# Patient Record
Sex: Female | Born: 1955
Health system: Southern US, Community
[De-identification: ages and names within clinical notes are randomized; demographics above are authoritative.]

## PROBLEM LIST (undated history)

## (undated) DIAGNOSIS — I10 Essential (primary) hypertension: Secondary | ICD-10-CM

## (undated) DIAGNOSIS — F319 Bipolar disorder, unspecified: Secondary | ICD-10-CM

## (undated) HISTORY — DX: Bipolar disorder, unspecified: F31.9

## (undated) HISTORY — DX: Essential (primary) hypertension: I10

---

## 2014-06-21 DIAGNOSIS — R3129 Other microscopic hematuria: Secondary | ICD-10-CM | POA: Insufficient documentation

## 2016-07-21 DIAGNOSIS — N390 Urinary tract infection, site not specified: Secondary | ICD-10-CM | POA: Insufficient documentation

## 2017-10-26 DIAGNOSIS — M1909 Primary osteoarthritis, other specified site: Secondary | ICD-10-CM | POA: Insufficient documentation

## 2019-07-31 DIAGNOSIS — R110 Nausea: Secondary | ICD-10-CM | POA: Insufficient documentation

## 2019-12-19 DIAGNOSIS — R634 Abnormal weight loss: Secondary | ICD-10-CM | POA: Insufficient documentation

## 2019-12-19 DIAGNOSIS — Z9884 Bariatric surgery status: Secondary | ICD-10-CM | POA: Insufficient documentation

## 2020-12-04 DIAGNOSIS — F411 Generalized anxiety disorder: Secondary | ICD-10-CM | POA: Insufficient documentation

## 2020-12-04 DIAGNOSIS — F3181 Bipolar II disorder: Secondary | ICD-10-CM | POA: Insufficient documentation

## 2020-12-04 DIAGNOSIS — I10 Essential (primary) hypertension: Secondary | ICD-10-CM | POA: Insufficient documentation

## 2020-12-04 DIAGNOSIS — F17210 Nicotine dependence, cigarettes, uncomplicated: Secondary | ICD-10-CM | POA: Insufficient documentation

## 2021-08-04 DIAGNOSIS — E538 Deficiency of other specified B group vitamins: Secondary | ICD-10-CM | POA: Diagnosis not present

## 2021-08-04 DIAGNOSIS — R5383 Other fatigue: Secondary | ICD-10-CM | POA: Diagnosis not present

## 2021-08-04 DIAGNOSIS — Z136 Encounter for screening for cardiovascular disorders: Secondary | ICD-10-CM | POA: Diagnosis not present

## 2021-08-04 DIAGNOSIS — F418 Other specified anxiety disorders: Secondary | ICD-10-CM | POA: Diagnosis not present

## 2021-08-04 DIAGNOSIS — Z0001 Encounter for general adult medical examination with abnormal findings: Secondary | ICD-10-CM | POA: Diagnosis not present

## 2021-08-04 DIAGNOSIS — Z131 Encounter for screening for diabetes mellitus: Secondary | ICD-10-CM | POA: Diagnosis not present

## 2021-08-04 DIAGNOSIS — I10 Essential (primary) hypertension: Secondary | ICD-10-CM | POA: Diagnosis not present

## 2021-08-18 DIAGNOSIS — I10 Essential (primary) hypertension: Secondary | ICD-10-CM | POA: Diagnosis not present

## 2021-08-18 DIAGNOSIS — E782 Mixed hyperlipidemia: Secondary | ICD-10-CM | POA: Diagnosis not present

## 2021-08-18 DIAGNOSIS — F418 Other specified anxiety disorders: Secondary | ICD-10-CM | POA: Diagnosis not present

## 2021-08-18 DIAGNOSIS — E538 Deficiency of other specified B group vitamins: Secondary | ICD-10-CM | POA: Diagnosis not present

## 2021-08-22 DIAGNOSIS — M26603 Bilateral temporomandibular joint disorder, unspecified: Secondary | ICD-10-CM | POA: Diagnosis not present

## 2021-09-04 DIAGNOSIS — M542 Cervicalgia: Secondary | ICD-10-CM | POA: Diagnosis not present

## 2021-09-04 DIAGNOSIS — R519 Headache, unspecified: Secondary | ICD-10-CM | POA: Diagnosis not present

## 2021-09-04 DIAGNOSIS — M26603 Bilateral temporomandibular joint disorder, unspecified: Secondary | ICD-10-CM | POA: Diagnosis not present

## 2021-09-04 DIAGNOSIS — M2560 Stiffness of unspecified joint, not elsewhere classified: Secondary | ICD-10-CM | POA: Diagnosis not present

## 2021-09-05 DIAGNOSIS — K921 Melena: Secondary | ICD-10-CM | POA: Diagnosis not present

## 2021-09-05 DIAGNOSIS — R197 Diarrhea, unspecified: Secondary | ICD-10-CM | POA: Diagnosis not present

## 2021-09-05 DIAGNOSIS — Z86010 Personal history of colonic polyps: Secondary | ICD-10-CM | POA: Diagnosis not present

## 2021-09-10 DIAGNOSIS — R195 Other fecal abnormalities: Secondary | ICD-10-CM | POA: Diagnosis not present

## 2021-09-10 DIAGNOSIS — K6389 Other specified diseases of intestine: Secondary | ICD-10-CM | POA: Diagnosis not present

## 2021-09-10 DIAGNOSIS — Z9884 Bariatric surgery status: Secondary | ICD-10-CM | POA: Diagnosis not present

## 2021-09-10 DIAGNOSIS — K921 Melena: Secondary | ICD-10-CM | POA: Diagnosis not present

## 2021-09-10 DIAGNOSIS — R197 Diarrhea, unspecified: Secondary | ICD-10-CM | POA: Diagnosis not present

## 2021-09-10 DIAGNOSIS — K648 Other hemorrhoids: Secondary | ICD-10-CM | POA: Diagnosis not present

## 2021-09-10 DIAGNOSIS — Z8719 Personal history of other diseases of the digestive system: Secondary | ICD-10-CM | POA: Diagnosis not present

## 2021-09-10 DIAGNOSIS — Z86010 Personal history of colonic polyps: Secondary | ICD-10-CM | POA: Diagnosis not present

## 2021-09-17 DIAGNOSIS — M2560 Stiffness of unspecified joint, not elsewhere classified: Secondary | ICD-10-CM | POA: Diagnosis not present

## 2021-09-17 DIAGNOSIS — M542 Cervicalgia: Secondary | ICD-10-CM | POA: Diagnosis not present

## 2021-09-17 DIAGNOSIS — F411 Generalized anxiety disorder: Secondary | ICD-10-CM | POA: Diagnosis not present

## 2021-09-17 DIAGNOSIS — R519 Headache, unspecified: Secondary | ICD-10-CM | POA: Diagnosis not present

## 2021-09-17 DIAGNOSIS — F32A Depression, unspecified: Secondary | ICD-10-CM | POA: Diagnosis not present

## 2021-09-17 DIAGNOSIS — M26603 Bilateral temporomandibular joint disorder, unspecified: Secondary | ICD-10-CM | POA: Diagnosis not present

## 2021-09-22 ENCOUNTER — Ambulatory Visit: Payer: BLUE CROSS/BLUE SHIELD | Admitting: Cardiology

## 2021-09-22 DIAGNOSIS — J209 Acute bronchitis, unspecified: Secondary | ICD-10-CM | POA: Diagnosis not present

## 2021-09-26 DIAGNOSIS — M2560 Stiffness of unspecified joint, not elsewhere classified: Secondary | ICD-10-CM | POA: Diagnosis not present

## 2021-09-26 DIAGNOSIS — M26603 Bilateral temporomandibular joint disorder, unspecified: Secondary | ICD-10-CM | POA: Diagnosis not present

## 2021-09-26 DIAGNOSIS — R519 Headache, unspecified: Secondary | ICD-10-CM | POA: Diagnosis not present

## 2021-09-26 DIAGNOSIS — M542 Cervicalgia: Secondary | ICD-10-CM | POA: Diagnosis not present

## 2021-10-02 DIAGNOSIS — M79675 Pain in left toe(s): Secondary | ICD-10-CM | POA: Diagnosis not present

## 2021-10-03 DIAGNOSIS — R519 Headache, unspecified: Secondary | ICD-10-CM | POA: Diagnosis not present

## 2021-10-03 DIAGNOSIS — M26603 Bilateral temporomandibular joint disorder, unspecified: Secondary | ICD-10-CM | POA: Diagnosis not present

## 2021-10-03 DIAGNOSIS — M2560 Stiffness of unspecified joint, not elsewhere classified: Secondary | ICD-10-CM | POA: Diagnosis not present

## 2021-10-03 DIAGNOSIS — M542 Cervicalgia: Secondary | ICD-10-CM | POA: Diagnosis not present

## 2021-10-07 ENCOUNTER — Encounter: Payer: Self-pay | Admitting: Podiatry

## 2021-10-07 ENCOUNTER — Ambulatory Visit (INDEPENDENT_AMBULATORY_CARE_PROVIDER_SITE_OTHER): Payer: BLUE CROSS/BLUE SHIELD | Admitting: Podiatry

## 2021-10-07 ENCOUNTER — Ambulatory Visit (INDEPENDENT_AMBULATORY_CARE_PROVIDER_SITE_OTHER): Payer: BLUE CROSS/BLUE SHIELD

## 2021-10-07 DIAGNOSIS — M2042 Other hammer toe(s) (acquired), left foot: Secondary | ICD-10-CM

## 2021-10-07 DIAGNOSIS — M7752 Other enthesopathy of left foot: Secondary | ICD-10-CM

## 2021-10-07 DIAGNOSIS — M778 Other enthesopathies, not elsewhere classified: Secondary | ICD-10-CM | POA: Diagnosis not present

## 2021-10-07 DIAGNOSIS — G5792 Unspecified mononeuropathy of left lower limb: Secondary | ICD-10-CM

## 2021-10-07 MED ORDER — TRIAMCINOLONE ACETONIDE 40 MG/ML IJ SUSP
60.0000 mg | Freq: Once | INTRAMUSCULAR | Status: AC
Start: 2021-10-07 — End: 2021-10-07
  Administered 2021-10-07: 60 mg

## 2021-10-07 NOTE — Progress Notes (Signed)
  Subjective:  Patient ID: Desiree Torres, female    DOB: 06/28/1956,  MRN: [id number] HPI Chief Complaint  Patient presents with   Toe Pain    2-5 toes left - aching x 1 year, callused area tip of 3rd toe left, throbs at night, tried using Voltaren gel   New Patient (Initial Visit)    65 y.o. female presents with the above complaint.   ROS: Denies fever chills nausea vomiting muscle aches pains calf pain back pain chest pain shortness of breath.  No past medical history on file.   Current Outpatient Medications:    amLODipine (NORVASC) 10 MG tablet, Take by mouth., Disp: , Rfl:    ARIPiprazole (ABILIFY) 10 MG tablet, Take by mouth., Disp: , Rfl:    BUPROPION HBR ER PO, Take by mouth., Disp: , Rfl:    DULoxetine (CYMBALTA) 30 MG capsule, Take 1 capsule in the morning and 2 capsules at night, Disp: , Rfl:    Multiple Vitamin (MULTIVITAMIN) capsule, Take 1 capsule by mouth daily., Disp: , Rfl:    telmisartan-hydrochlorothiazide (MICARDIS HCT) 80-12.5 MG tablet, , Disp: , Rfl:    traZODone (DESYREL) 50 MG tablet, Take 2-3 tablets at bedtime for sleep., Disp: , Rfl:    omeprazole (PRILOSEC) 20 MG capsule, Take by mouth., Disp: , Rfl:   Allergies  Allergen Reactions   Pork Allergy     Other reaction(s): GI Upset (intolerance)   Pork-Derived Products     Other reaction(s): GI Upset   Ham     Other reaction(s): GI Upset   Morphine Hives and Itching    hives hives hives hives    Review of Systems Objective:  There were no vitals filed for this visit.  General: Well developed, nourished, in no acute distress, alert and oriented x3   Dermatological: Skin is warm, dry and supple bilateral. Nails x 10 are well maintained; remaining integument appears unremarkable at this time. There are no open sores, no preulcerative lesions, no rash or signs of infection present.  Vascular: Dorsalis Pedis artery and Posterior Tibial artery pedal pulses are 2/4 bilateral with immedate capillary  fill time. Pedal hair growth present. No varicosities and no lower extremity edema present bilateral.   Neruologic: Grossly intact via light touch bilateral. Vibratory intact via tuning fork bilateral. Protective threshold with Semmes Wienstein monofilament intact to all pedal sites bilateral. Patellar and Achilles deep tendon reflexes 2+ bilateral. No Babinski or clonus noted bilateral.   Musculoskeletal: No gross boney pedal deformities bilateral. No pain, crepitus, or limitation noted with foot and ankle range of motion bilateral. Muscular strength 5/5 in all groups tested bilateral.  Pain on palpation and range of motion of the second third fourth metatarsal phalange joints of the left foot.  She also has some interdigital pain consistent with possible neuroma.  Gait: Unassisted, Nonantalgic.    Radiographs:  Radiographs demonstrate no osseous abnormalities forefoot left  Assessment & Plan:   Assessment: Capsulitis second third and fourth metatarsal phalangeal joints.  Possible neuroma interdigital spaces.  Plan: Injected 3 areas today 10 mg forefoot Kenalog second third fourth interdigital spaces.     Desiree Torres T. [place], [place]

## 2021-10-09 DIAGNOSIS — M26603 Bilateral temporomandibular joint disorder, unspecified: Secondary | ICD-10-CM | POA: Diagnosis not present

## 2021-10-09 DIAGNOSIS — R519 Headache, unspecified: Secondary | ICD-10-CM | POA: Diagnosis not present

## 2021-10-09 DIAGNOSIS — M542 Cervicalgia: Secondary | ICD-10-CM | POA: Diagnosis not present

## 2021-10-09 DIAGNOSIS — M2560 Stiffness of unspecified joint, not elsewhere classified: Secondary | ICD-10-CM | POA: Diagnosis not present

## 2021-10-10 ENCOUNTER — Ambulatory Visit: Payer: BLUE CROSS/BLUE SHIELD | Admitting: Cardiology

## 2021-10-15 DIAGNOSIS — F411 Generalized anxiety disorder: Secondary | ICD-10-CM | POA: Diagnosis not present

## 2021-10-15 DIAGNOSIS — F32A Depression, unspecified: Secondary | ICD-10-CM | POA: Diagnosis not present

## 2021-10-17 DIAGNOSIS — M26603 Bilateral temporomandibular joint disorder, unspecified: Secondary | ICD-10-CM | POA: Diagnosis not present

## 2021-10-17 DIAGNOSIS — R519 Headache, unspecified: Secondary | ICD-10-CM | POA: Diagnosis not present

## 2021-10-17 DIAGNOSIS — M542 Cervicalgia: Secondary | ICD-10-CM | POA: Diagnosis not present

## 2021-10-17 DIAGNOSIS — R197 Diarrhea, unspecified: Secondary | ICD-10-CM | POA: Diagnosis not present

## 2021-10-17 DIAGNOSIS — M2560 Stiffness of unspecified joint, not elsewhere classified: Secondary | ICD-10-CM | POA: Diagnosis not present

## 2021-10-24 ENCOUNTER — Other Ambulatory Visit: Payer: Self-pay

## 2021-10-24 ENCOUNTER — Ambulatory Visit: Payer: BLUE CROSS/BLUE SHIELD | Admitting: Cardiology

## 2021-10-24 ENCOUNTER — Encounter: Payer: Self-pay | Admitting: Cardiology

## 2021-10-24 VITALS — BP 136/73 | HR 60 | Temp 98.0°F | Resp 16 | Ht 65.0 in | Wt 153.0 lb

## 2021-10-24 DIAGNOSIS — I3139 Other pericardial effusion (noninflammatory): Secondary | ICD-10-CM

## 2021-10-24 DIAGNOSIS — I10 Essential (primary) hypertension: Secondary | ICD-10-CM | POA: Diagnosis not present

## 2021-10-24 DIAGNOSIS — F17210 Nicotine dependence, cigarettes, uncomplicated: Secondary | ICD-10-CM

## 2021-10-24 DIAGNOSIS — E782 Mixed hyperlipidemia: Secondary | ICD-10-CM | POA: Diagnosis not present

## 2021-10-24 DIAGNOSIS — F1290 Cannabis use, unspecified, uncomplicated: Secondary | ICD-10-CM

## 2021-10-24 DIAGNOSIS — E538 Deficiency of other specified B group vitamins: Secondary | ICD-10-CM | POA: Diagnosis not present

## 2021-10-24 NOTE — Progress Notes (Signed)
Date:  10/24/2021   ID:  Desiree Torres, DOB Nov 10, 1956, MRN [id number]  PCP:  Genia Hotter, FNP  Cardiologist:  Tessa Lerner, DO, [facility] (established care 10/24/21) Former Cardiologist: Dr.Thanawala at [facility] in [place].   REASON FOR CONSULT: Fluid around the heart  REQUESTING PHYSICIAN: Self-referred  Chief Complaint  Patient presents with   New Patient (Initial Visit)    Self-referral to the office. History of fluid around the heart.   Establish care    HPI  Desiree Torres is a 65 y.o. African-American female who presents to the office with a chief complaint of " history of fluid around the heart." Patient's past medical history and cardiovascular risk factors include: Hypertension, hyperlipidemia, bipolar, Depression, cigarette smoking, marijuana use, postmenopausal, advance age.   She is self referred to the office for evaluation of fluid around my heart.   Patient recently moved from [place] to [place] as of August 2022.  She was seeing a cardiologist back in [place] and was told that she has fluid around the heart.  She does not know any additional details.  When asked that she denies undergoing pericardiocentesis.  Patient states that also recalls as the medications for her blood pressure were changed and now she is on amlodipine.  No records available in Care Everywhere.  Patient is not the best historian with regards to her past medical history.  No prior records available for review.  She denies any chest pain or shortness of breath at rest or with effort related activities.  She is overall euvolemic and not in congestive heart failure.  And no hospitalizations or urgent care visits recently for cardiovascular symptoms.  FUNCTIONAL STATUS: She states that every other day she walks 2 miles.    ALLERGIES: Allergies  Allergen Reactions   Pork Allergy     Other reaction(s): GI Upset (intolerance)   Pork-Derived Products     Other reaction(s): GI Upset    Ham     Other reaction(s): GI Upset   Morphine Hives and Itching    hives hives hives hives     MEDICATION LIST PRIOR TO VISIT: Current Meds  Medication Sig   amLODipine (NORVASC) 10 MG tablet Take by mouth.   ARIPiprazole (ABILIFY) 10 MG tablet Take by mouth.   BUPROPION HBR ER PO Take by mouth.   DULoxetine (CYMBALTA) 30 MG capsule Take 1 capsule in the morning and 2 capsules at night   folic acid (FOLVITE) 1 MG tablet Take 1 mg by mouth daily.   Multiple Vitamin (MULTIVITAMIN) capsule Take 1 capsule by mouth daily.   omeprazole (PRILOSEC) 20 MG capsule Take by mouth.   telmisartan-hydrochlorothiazide (MICARDIS HCT) 80-12.5 MG tablet    timolol (TIMOPTIC) 0.5 % ophthalmic solution SMARTSIG:In Eye(s)   traZODone (DESYREL) 50 MG tablet Take 2-3 tablets at bedtime for sleep.     PAST MEDICAL HISTORY: Past Medical History:  Diagnosis Date   Bipolar depression (HCC)    Hypertension     PAST SURGICAL HISTORY: History reviewed. No pertinent surgical history.  FAMILY HISTORY: The patient family history includes Cancer in her father; Heart disease in her mother and sister.  SOCIAL HISTORY:  The patient  reports that she has been smoking cigarettes. She has a 12.50 pack-year smoking history. She has never used smokeless tobacco. She reports current alcohol use. She reports current drug use. Drug: Marijuana.  REVIEW OF SYSTEMS: Review of Systems  Constitutional: Negative for chills and fever.  HENT:  Negative for  hoarse voice and nosebleeds.   Eyes:  Negative for discharge, double vision and pain.  Cardiovascular:  Negative for chest pain, claudication, dyspnea on exertion, leg swelling, near-syncope, orthopnea, palpitations, paroxysmal nocturnal dyspnea and syncope.  Respiratory:  Negative for hemoptysis and shortness of breath.   Musculoskeletal:  Negative for muscle cramps and myalgias.  Gastrointestinal:  Negative for abdominal pain, constipation, diarrhea, hematemesis,  hematochezia, melena, nausea and vomiting.  Neurological:  Negative for dizziness and light-headedness.   PHYSICAL EXAM: Vitals with BMI 10/24/2021  Height 5\' 5"   Weight 153 lbs  BMI 25.46  Systolic 136  Diastolic 73  Pulse 60    CONSTITUTIONAL: Well-developed and well-nourished. No acute distress.  SKIN: Skin is warm and dry. No rash noted. No cyanosis. No pallor. No jaundice HEAD: Normocephalic and atraumatic.  EYES: No scleral icterus MOUTH/THROAT: Moist oral membranes.  NECK: No JVD present. No thyromegaly noted. No carotid bruits  LYMPHATIC: No visible cervical adenopathy.  CHEST Normal respiratory effort. No intercostal retractions  LUNGS: Clear to auscultation bilaterally.  No stridor. No wheezes. No rales.  CARDIOVASCULAR: Regular rate and rhythm, positive S1-S2, no murmurs rubs or gallops appreciated. ABDOMINAL:  No apparent ascites.  EXTREMITIES: No peripheral edema, warm to touch, HEMATOLOGIC: No significant bruising NEUROLOGIC: Oriented to person, place, and time. Nonfocal. Normal muscle tone.  PSYCHIATRIC: Normal mood and affect. Normal behavior. Cooperative  CARDIAC DATABASE: EKG: 10/24/2021: Sinus bradycardia, 54 bpm, normal axis, without underlying ischemia or injury pattern.  Echocardiogram: No results found for this or any previous visit from the past 1095 days.    Stress Testing: No results found for this or any previous visit from the past 1095 days.   Heart Catheterization: None  LABORATORY DATA: No flowsheet data found.  No flowsheet data found.  Lipid Panel  No results found for: CHOL, TRIG, HDL, CHOLHDL, VLDL, LDLCALC, LDLDIRECT, LABVLDL  No components found for: NTPROBNP No results for input(s): PROBNP in the last 8760 hours. No results for input(s): TSH in the last 8760 hours.  BMP No results for input(s): NA, K, CL, CO2, GLUCOSE, BUN, CREATININE, CALCIUM, GFRNONAA, GFRAA in the last 8760 hours.  HEMOGLOBIN A1C No results found for:  HGBA1C, MPG  External Labs: Collected: 12/04/2020 Care Everywhere Total cholesterol 224, triglycerides 114, HDL 69, LDL 132, non-HDL 155 Hemoglobin A1c 5.4 TSH 1.26 Sodium 140, potassium 3.5, chloride 107, BUN 15, creatinine 0.92. AST 15, ALT 14, alkaline phosphatase 82   IMPRESSION:    ICD-10-CM   1. Pericardial effusion  I31.39 ECHOCARDIOGRAM COMPLETE    2. Benign hypertension  I10 EKG 12-Lead    3. Mixed hyperlipidemia  E78.2     4. Cigarette nicotine dependence without complication  F17.210     5. Marijuana use  F12.90        RECOMMENDATIONS: Desiree Torres is a 65 y.o. African-American female whose past medical history and cardiac risk factors include: Hypertension, hyperlipidemia, bipolar, Depression, cigarette smoking, marijuana use, postmenopausal, advance age.   Per patient she has a history of pericardial effusion.  I do not have any records to verify this information.  She states that it was diagnosed last year and her medications were changed and now she is on amlodipine.  No records are available in Care Everywhere.  I reached out to her former cardiologist as part of today's visit to obtain prior records.  For now would like to repeat echocardiogram to evaluate for LVEF and the severity of pericardial effusion.  Medications reconciled.  Patient is  educated on the importance of complete cessation of cigarette smoking and marijuana use.  Further recommendations to follow upon availability of records and testing results.  FINAL MEDICATION LIST END OF ENCOUNTER: No orders of the defined types were placed in this encounter.   There are no discontinued medications.   Current Outpatient Medications:    amLODipine (NORVASC) 10 MG tablet, Take by mouth., Disp: , Rfl:    ARIPiprazole (ABILIFY) 10 MG tablet, Take by mouth., Disp: , Rfl:    BUPROPION HBR ER PO, Take by mouth., Disp: , Rfl:    DULoxetine (CYMBALTA) 30 MG capsule, Take 1 capsule in the morning and 2  capsules at night, Disp: , Rfl:    folic acid (FOLVITE) 1 MG tablet, Take 1 mg by mouth daily., Disp: , Rfl:    Multiple Vitamin (MULTIVITAMIN) capsule, Take 1 capsule by mouth daily., Disp: , Rfl:    omeprazole (PRILOSEC) 20 MG capsule, Take by mouth., Disp: , Rfl:    telmisartan-hydrochlorothiazide (MICARDIS HCT) 80-12.5 MG tablet, , Disp: , Rfl:    timolol (TIMOPTIC) 0.5 % ophthalmic solution, SMARTSIG:In Eye(s), Disp: , Rfl:    traZODone (DESYREL) 50 MG tablet, Take 2-3 tablets at bedtime for sleep., Disp: , Rfl:   Orders Placed This Encounter  Procedures   EKG 12-Lead   ECHOCARDIOGRAM COMPLETE    There are no Patient Instructions on file for this visit.   --Continue cardiac medications as reconciled in final medication list. --Return in about 4 weeks (around 11/21/2021) for Follow up after echocardiogram. Or sooner if needed. --Continue follow-up with your primary care physician regarding the management of your other chronic comorbid conditions.  Patient's questions and concerns were addressed to her satisfaction. She voices understanding of the instructions provided during this encounter.   This note was created using a voice recognition software as a result there may be grammatical errors inadvertently enclosed that do not reflect the nature of this encounter. Every attempt is made to correct such errors.  Tessa Lerner, [place], [facility]  Pager: [phone] Office: [phone]

## 2021-10-27 DIAGNOSIS — R928 Other abnormal and inconclusive findings on diagnostic imaging of breast: Secondary | ICD-10-CM | POA: Diagnosis not present

## 2021-10-27 DIAGNOSIS — R922 Inconclusive mammogram: Secondary | ICD-10-CM | POA: Diagnosis not present

## 2021-10-28 ENCOUNTER — Other Ambulatory Visit: Payer: BLUE CROSS/BLUE SHIELD

## 2021-10-31 DIAGNOSIS — M26603 Bilateral temporomandibular joint disorder, unspecified: Secondary | ICD-10-CM | POA: Diagnosis not present

## 2021-10-31 DIAGNOSIS — R519 Headache, unspecified: Secondary | ICD-10-CM | POA: Diagnosis not present

## 2021-10-31 DIAGNOSIS — M542 Cervicalgia: Secondary | ICD-10-CM | POA: Diagnosis not present

## 2021-10-31 DIAGNOSIS — M2560 Stiffness of unspecified joint, not elsewhere classified: Secondary | ICD-10-CM | POA: Diagnosis not present

## 2021-11-05 ENCOUNTER — Other Ambulatory Visit: Payer: Self-pay

## 2021-11-05 ENCOUNTER — Ambulatory Visit: Payer: BLUE CROSS/BLUE SHIELD

## 2021-11-05 DIAGNOSIS — I3139 Other pericardial effusion (noninflammatory): Secondary | ICD-10-CM | POA: Diagnosis not present

## 2021-11-05 DIAGNOSIS — I10 Essential (primary) hypertension: Secondary | ICD-10-CM | POA: Diagnosis not present

## 2021-11-12 DIAGNOSIS — Z0000 Encounter for general adult medical examination without abnormal findings: Secondary | ICD-10-CM | POA: Diagnosis not present

## 2021-11-12 DIAGNOSIS — M2560 Stiffness of unspecified joint, not elsewhere classified: Secondary | ICD-10-CM | POA: Diagnosis not present

## 2021-11-12 DIAGNOSIS — M542 Cervicalgia: Secondary | ICD-10-CM | POA: Diagnosis not present

## 2021-11-12 DIAGNOSIS — M26603 Bilateral temporomandibular joint disorder, unspecified: Secondary | ICD-10-CM | POA: Diagnosis not present

## 2021-11-12 DIAGNOSIS — R519 Headache, unspecified: Secondary | ICD-10-CM | POA: Diagnosis not present

## 2021-11-14 DIAGNOSIS — D242 Benign neoplasm of left breast: Secondary | ICD-10-CM | POA: Diagnosis not present

## 2021-11-14 DIAGNOSIS — D0512 Intraductal carcinoma in situ of left breast: Secondary | ICD-10-CM | POA: Diagnosis not present

## 2021-11-14 DIAGNOSIS — N6012 Diffuse cystic mastopathy of left breast: Secondary | ICD-10-CM | POA: Diagnosis not present

## 2021-11-14 DIAGNOSIS — N6002 Solitary cyst of left breast: Secondary | ICD-10-CM | POA: Diagnosis not present

## 2021-11-14 DIAGNOSIS — N6342 Unspecified lump in left breast, subareolar: Secondary | ICD-10-CM | POA: Diagnosis not present

## 2021-11-17 DIAGNOSIS — Z01419 Encounter for gynecological examination (general) (routine) without abnormal findings: Secondary | ICD-10-CM | POA: Diagnosis not present

## 2021-11-18 HISTORY — PX: BREAST BIOPSY: SHX20

## 2021-11-24 ENCOUNTER — Other Ambulatory Visit: Payer: Self-pay | Admitting: Cardiology

## 2021-11-24 ENCOUNTER — Ambulatory Visit: Payer: BLUE CROSS/BLUE SHIELD | Admitting: Cardiology

## 2021-11-25 ENCOUNTER — Other Ambulatory Visit: Payer: Self-pay | Admitting: Cardiology

## 2021-11-25 DIAGNOSIS — I3139 Other pericardial effusion (noninflammatory): Secondary | ICD-10-CM

## 2021-11-26 DIAGNOSIS — Z0000 Encounter for general adult medical examination without abnormal findings: Secondary | ICD-10-CM | POA: Diagnosis not present

## 2021-11-26 DIAGNOSIS — E538 Deficiency of other specified B group vitamins: Secondary | ICD-10-CM | POA: Diagnosis not present

## 2021-11-26 DIAGNOSIS — Z131 Encounter for screening for diabetes mellitus: Secondary | ICD-10-CM | POA: Diagnosis not present

## 2021-11-26 DIAGNOSIS — Z13220 Encounter for screening for lipoid disorders: Secondary | ICD-10-CM | POA: Diagnosis not present

## 2021-11-28 DIAGNOSIS — M26603 Bilateral temporomandibular joint disorder, unspecified: Secondary | ICD-10-CM | POA: Diagnosis not present

## 2021-11-28 DIAGNOSIS — R519 Headache, unspecified: Secondary | ICD-10-CM | POA: Diagnosis not present

## 2021-11-28 DIAGNOSIS — M542 Cervicalgia: Secondary | ICD-10-CM | POA: Diagnosis not present

## 2021-11-28 DIAGNOSIS — M2560 Stiffness of unspecified joint, not elsewhere classified: Secondary | ICD-10-CM | POA: Diagnosis not present

## 2021-12-02 NOTE — Progress Notes (Signed)
Called pt no answer, left a vm

## 2021-12-03 ENCOUNTER — Other Ambulatory Visit: Payer: Self-pay

## 2021-12-03 ENCOUNTER — Encounter: Payer: Self-pay | Admitting: Podiatry

## 2021-12-03 ENCOUNTER — Ambulatory Visit: Payer: BLUE CROSS/BLUE SHIELD | Admitting: Podiatry

## 2021-12-03 DIAGNOSIS — M778 Other enthesopathies, not elsewhere classified: Secondary | ICD-10-CM

## 2021-12-03 DIAGNOSIS — G5792 Unspecified mononeuropathy of left lower limb: Secondary | ICD-10-CM | POA: Diagnosis not present

## 2021-12-03 DIAGNOSIS — M2042 Other hammer toe(s) (acquired), left foot: Secondary | ICD-10-CM | POA: Diagnosis not present

## 2021-12-03 MED ORDER — METHYLPREDNISOLONE 4 MG PO TBPK: ORAL_TABLET | ORAL | 0 refills | Status: DC

## 2021-12-03 NOTE — Progress Notes (Signed)
Patient called back, I have discussed results with patient, she confirmed appointment for 20th.

## 2021-12-03 NOTE — Progress Notes (Signed)
Called patient, NA, LMAM

## 2021-12-03 NOTE — Progress Notes (Signed)
°  Subjective:  Patient ID: Desiree Torres, female    DOB: [birth year],   MRN: [id number]  Chief Complaint  Patient presents with   capsulitis    F/U LT capsulitis/neuritis -pt states," the shot only lasted for a little bit, pain is back to where it used to be; 7/10." - no swelling/numbness/tingling Tx: diclofenac cream     65 y.o. female presents for follow-up of capsulitis/neuritis of left hammertoes. Patient relates the injection she received from Dr. Al Corpus worked for about 2 weeks but now that pain has returned and wondering what she can do . Denies any other pedal complaints. Denies n/v/f/c.   Past Medical History:  Diagnosis Date   Bipolar depression (HCC)    Hypertension     Objective:  Physical Exam: Vascular: DP/PT pulses 2/4 bilateral. CFT <3 seconds. Normal hair growth on digits. No edema.  Skin. No lacerations or abrasions bilateral feet.  Musculoskeletal: MMT 5/5 bilateral lower extremities in DF, PF, Inversion and Eversion. Deceased ROM in DF of ankle joint. Hammered digits 2-4 bilateral. Tender to dorsal palpation of the digits. Pain with ROM of digits 2-4.  Neurological: Sensation intact to light touch.   Assessment:  No diagnosis found.   Plan:  Patient was evaluated and treated and all questions answered. -X-rays reviewed. -Educated on hammertoes and neuritis  and treatment options  -Discussed padding including toe caps and crest pads.  -Prescription for medrol dose pack provided to aid with inflammation.  -Patient to follow-up as needed. Discussed calling if any changes or increased pain.    Desiree Torres, DPM

## 2021-12-05 DIAGNOSIS — H04121 Dry eye syndrome of right lacrimal gland: Secondary | ICD-10-CM | POA: Diagnosis not present

## 2021-12-05 DIAGNOSIS — Z961 Presence of intraocular lens: Secondary | ICD-10-CM | POA: Diagnosis not present

## 2021-12-05 DIAGNOSIS — H16223 Keratoconjunctivitis sicca, not specified as Sjogren's, bilateral: Secondary | ICD-10-CM | POA: Diagnosis not present

## 2021-12-05 DIAGNOSIS — H04123 Dry eye syndrome of bilateral lacrimal glands: Secondary | ICD-10-CM | POA: Diagnosis not present

## 2021-12-06 DIAGNOSIS — J069 Acute upper respiratory infection, unspecified: Secondary | ICD-10-CM | POA: Diagnosis not present

## 2021-12-06 DIAGNOSIS — U071 COVID-19: Secondary | ICD-10-CM | POA: Diagnosis not present

## 2021-12-06 DIAGNOSIS — Z20822 Contact with and (suspected) exposure to covid-19: Secondary | ICD-10-CM | POA: Diagnosis not present

## 2021-12-06 DIAGNOSIS — R059 Cough, unspecified: Secondary | ICD-10-CM | POA: Diagnosis not present

## 2021-12-09 ENCOUNTER — Ambulatory Visit: Payer: BLUE CROSS/BLUE SHIELD | Admitting: Cardiology

## 2021-12-19 DIAGNOSIS — M9901 Segmental and somatic dysfunction of cervical region: Secondary | ICD-10-CM | POA: Diagnosis not present

## 2021-12-19 DIAGNOSIS — M26601 Right temporomandibular joint disorder, unspecified: Secondary | ICD-10-CM | POA: Diagnosis not present

## 2021-12-19 DIAGNOSIS — M26602 Left temporomandibular joint disorder, unspecified: Secondary | ICD-10-CM | POA: Diagnosis not present

## 2021-12-19 DIAGNOSIS — M5409 Panniculitis affecting regions, neck and back, multiple sites in spine: Secondary | ICD-10-CM | POA: Diagnosis not present

## 2021-12-31 DIAGNOSIS — F5105 Insomnia due to other mental disorder: Secondary | ICD-10-CM | POA: Insufficient documentation

## 2021-12-31 DIAGNOSIS — F99 Mental disorder, not otherwise specified: Secondary | ICD-10-CM | POA: Insufficient documentation

## 2022-01-21 ENCOUNTER — Encounter: Payer: Self-pay | Admitting: Podiatry

## 2022-01-21 ENCOUNTER — Other Ambulatory Visit: Payer: Self-pay

## 2022-01-21 ENCOUNTER — Ambulatory Visit (INDEPENDENT_AMBULATORY_CARE_PROVIDER_SITE_OTHER): Payer: Self-pay | Admitting: Podiatry

## 2022-01-21 DIAGNOSIS — M2042 Other hammer toe(s) (acquired), left foot: Secondary | ICD-10-CM

## 2022-01-21 DIAGNOSIS — M778 Other enthesopathies, not elsewhere classified: Secondary | ICD-10-CM

## 2022-01-21 DIAGNOSIS — M7742 Metatarsalgia, left foot: Secondary | ICD-10-CM

## 2022-01-21 NOTE — Progress Notes (Signed)
°  Subjective:  Patient ID: Desiree Torres, female    DOB: [birth year],   MRN: [id number]  Chief Complaint  Patient presents with   Foot Pain    Follow up bilateral foot pain   "I still have pain in both. The left looks like its changing colors and the right one has this hard place on it"    66 y.o. female presents for follow-up of capsulitis/neuritis of left hammertoes. Relates the injeciton caused discoloration of her foot. Relates she has continued to have pain but now more on the bottom of the ball of the foot. Also relates some calluses she would like trimmed.  Denies any other pedal complaints. Denies n/v/f/c.   Past Medical History:  Diagnosis Date   Bipolar depression ([place])    Hypertension     Objective:  Physical Exam: Vascular: DP/PT pulses 2/4 bilateral. CFT <3 seconds. Normal hair growth on digits. No edema.  Skin. No lacerations or abrasions bilateral feet.  Hyperkeratotic lesions noted to distal third left digit, right medial hallux and right plantar fifth metatrsal.  Musculoskeletal: MMT 5/5 bilateral lower extremities in DF, PF, Inversion and Eversion. Deceased ROM in DF of ankle joint. Hammered digits 2-4 bilateral. Tender to plantar palpation of the metatarsal heads 2-4. Pain with ROM of digits 2-4.  Neurological: Sensation intact to light touch.   Assessment:   1. Hammer toe of left foot   2. Capsulitis of foot, left   3. Metatarsalgia of left foot      Plan:  Patient was evaluated and treated and all questions answered. -X-rays reviewed. -Educated on hammertoes and neuritis and metatarsalgia  and treatment options  -Discussed padding including metatarsal pads.  -Continue with anti-inflammatories  -Hyperkeratotic lesions trimmed as courtesy.  -Patient to follow-up as needed. Discussed calling if any changes or increased pain.    Lorenda Peck, DPM

## 2022-03-11 ENCOUNTER — Ambulatory Visit (INDEPENDENT_AMBULATORY_CARE_PROVIDER_SITE_OTHER): Payer: Medicare HMO | Admitting: Adult Health

## 2022-03-11 ENCOUNTER — Encounter: Payer: Self-pay | Admitting: Adult Health

## 2022-03-11 ENCOUNTER — Other Ambulatory Visit: Payer: Self-pay

## 2022-03-11 VITALS — BP 128/77 | HR 71 | Ht 65.0 in | Wt 144.0 lb

## 2022-03-11 DIAGNOSIS — F331 Major depressive disorder, recurrent, moderate: Secondary | ICD-10-CM | POA: Diagnosis not present

## 2022-03-11 DIAGNOSIS — F99 Mental disorder, not otherwise specified: Secondary | ICD-10-CM

## 2022-03-11 DIAGNOSIS — F411 Generalized anxiety disorder: Secondary | ICD-10-CM | POA: Diagnosis not present

## 2022-03-11 DIAGNOSIS — F419 Anxiety disorder, unspecified: Secondary | ICD-10-CM | POA: Insufficient documentation

## 2022-03-11 DIAGNOSIS — F3181 Bipolar II disorder: Secondary | ICD-10-CM | POA: Diagnosis not present

## 2022-03-11 DIAGNOSIS — F5105 Insomnia due to other mental disorder: Secondary | ICD-10-CM | POA: Diagnosis not present

## 2022-03-11 MED ORDER — BUSPIRONE HCL 5 MG PO TABS: 5.0000 mg | ORAL_TABLET | Freq: Three times a day (TID) | ORAL | 5 refills | Status: DC

## 2022-03-11 MED ORDER — TRAZODONE HCL 100 MG PO TABS: ORAL_TABLET | ORAL | 5 refills | Status: DC

## 2022-03-11 MED ORDER — ARIPIPRAZOLE 10 MG PO TABS: 10.0000 mg | ORAL_TABLET | Freq: Once | ORAL | 5 refills | Status: DC

## 2022-03-11 MED ORDER — DULOXETINE HCL 60 MG PO CPEP: 60.0000 mg | ORAL_CAPSULE | Freq: Every day | ORAL | 5 refills | Status: DC

## 2022-03-11 NOTE — Progress Notes (Signed)
[facility] MD/PA/NP Initial Note ? ?03/11/2022 11:02 AM ?Desiree Torres  ?MRN:  [id number] ? ?Chief Complaint:  ? ?HPI: ? ?Desiree Torres is seen today for initial psychiatric evaluation ? ?Previous diagnosis are GAD, MDD, insomnia and Bipolar disorder. ? ?Available collateral reviewed. ? ?Describes mood today as "hanging in there". Pleasant. Tearful at times. Mood symptoms - reports depression, anxiety, and irritability. Reports some worry and rumination. Reports mood inconsistencies - more down. Stating "I'm not doing as good as I would like". Reports taking medications before moving to [place] and felt like the regiemen worked well. She notes adjustment since finding a new provider in [place] and not doing as well - would like to return to previous medications. Is also wanting to start working with a therapist. Varying interest and motivation. Taking medications as prescribed. Reports moving to [place] from [place] after she and husband separated.  ?Energy levels lower. Active, does not have a regular exercise routine. Works full-time  ?Enjoys some usual interests and activities. Separated from husband of 2 years. Has 4 grown children. Moved to [place] fron [place] - family local. Spending time with family. ?Appetite decreased. Weight loss over the past year - 144 pounds - 65". ?Sleeps well most nights. Averages 3 to 4 hours - mind racing. ?Focus and concentration stable. Completing tasks. Managing aspects of household. 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 Works part time - 3 hours at an [employer] every day. ?Denies SI or HI.  ?Denies AH or VH. ?Denies recent alcohol use. ?Smokes tobacco and THC daily for pain - has a prostetic jaw and experinecing daily pain from it.  ? ?Previous medication trials:  Welbutrin, Buspar, Abilify, Lunesta, Trazadone, Cymbalta ? ?Visit Diagnosis:  ?  ICD-10-CM   ?1. Bipolar 2 disorder, major depressive episode (HCC)  F31.81 ARIPiprazole (ABILIFY) 10 MG tablet  ?  ?2. GAD  (generalized anxiety disorder)  F41.1 DULoxetine (CYMBALTA) 60 MG capsule  ?  busPIRone (BUSPAR) 5 MG tablet  ?  ?3. Insomnia due to other mental disorder  F51.05 traZODone (DESYREL) 100 MG tablet  ? F99   ?  ?4. Major depressive disorder, recurrent episode, moderate (HCC)  F33.1   ?  ? ? ?Past Psychiatric History: Admitted once last [place] of 2022 while living in [place] - nervous breakdown. ? ?Past Medical History:  ?Past Medical History:  ?Diagnosis Date  ? Bipolar depression (HCC)   ? Hypertension   ? No past surgical history on file. ? ?Family Psychiatric History: Denies any family history of mental illness.  ? ?Family History:  ?Family History  ?Problem Relation Age of Onset  ? Heart disease Mother   ? Cancer Father   ? Heart disease Sister   ? ? ?Social History:  ?Social History  ? ?Socioeconomic History  ? Marital status: Legally Separated  ?  Spouse name: Not on file  ? Number of children: 4  ? Years of education: Not on file  ? Highest education level: Not on file  ?Occupational History  ? Not on file  ?Tobacco Use  ? Smoking status: Every Day  ?  Packs/day: 0.50  ?  Years: 25.00  ?  Pack years: 12.50  ?  Types: Cigarettes  ? Smokeless tobacco: Never  ?Vaping Use  ? Vaping Use: Never used  ?Substance and Sexual Activity  ? Alcohol use: Yes  ?  Comment: occ  ? Drug use: Yes  ?  Types: Marijuana  ? Sexual activity: Not on  file  ?Other Topics Concern  ? Not on file  ?Social History Narrative  ? Not on file  ? ?Social Determinants of Health  ? ?Financial Resource Strain: Not on file  ?Food Insecurity: Not on file  ?Transportation Needs: Not on file  ?Physical Activity: Not on file  ?Stress: Not on file  ?Social Connections: Not on file  ? ? ?Allergies:  ?Allergies  ?Allergen Reactions  ? Pork Allergy   ?  Other reaction(s): GI Upset (intolerance)  ? Pork-Derived Products   ?  Other reaction(s): GI Upset  ? Ham   ?  Other reaction(s): GI Upset  ? Morphine Hives and Itching  ?  hives ?hives ?hives ?hives ?   ? ? ?Metabolic Disorder Labs: ?No results found for: HGBA1C, MPG ?No results found for: PROLACTIN ?No results found for: CHOL, TRIG, HDL, CHOLHDL, VLDL, LDLCALC ?No results found for: TSH ? ?Therapeutic Level Labs: ?No results found for: LITHIUM ?No results found for: VALPROATE ?No components found for:  CBMZ ? ?Current Medications: ?Current Outpatient Medications  ?Medication Sig Dispense Refill  ? busPIRone (BUSPAR) 5 MG tablet Take 1 tablet (5 mg total) by mouth 3 (three) times daily. 90 tablet 5  ? amLODipine (NORVASC) 10 MG tablet Take by mouth.    ? ARIPiprazole (ABILIFY) 10 MG tablet Take 1 tablet (10 mg total) by mouth once for 1 dose. 30 tablet 5  ? BUPROPION HBR ER PO Take by mouth.    ? DULoxetine (CYMBALTA) 60 MG capsule Take 1 capsule (60 mg total) by mouth daily. 30 capsule 5  ? folic acid (FOLVITE) 1 MG tablet Take 1 mg by mouth daily.    ? methylPREDNISolone (MEDROL DOSEPAK) 4 MG TBPK tablet Take as directed 21 tablet 0  ? Multiple Vitamin (MULTIVITAMIN) capsule Take 1 capsule by mouth daily.    ? omeprazole (PRILOSEC) 40 MG capsule Take 40 mg by mouth every morning.    ? telmisartan-hydrochlorothiazide (MICARDIS HCT) 80-12.5 MG tablet     ? timolol (TIMOPTIC) 0.5 % ophthalmic solution SMARTSIG:In Eye(s)    ? traZODone (DESYREL) 100 MG tablet Take 2-3 tablets at bedtime for sleep. 30 tablet 5  ? ?No current facility-administered medications for this visit.  ? ? ?Medication Side Effects: none ? ?Orders placed this visit:  No orders of the defined types were placed in this encounter. ? ? ?Psychiatric Specialty Exam: ? ?Review of Systems  ?Musculoskeletal:  Negative for gait problem.  ?Neurological:  Negative for tremors.  ?Psychiatric/Behavioral:    ?     Please refer to HPI   ?Blood pressure 128/77, pulse 71, height  (1.651 m), weight 144 lb (65.3 kg).Body mass index is 23.96 kg/m?.  ?General Appearance: Casual and Neat  ?Eye Contact:  Good  ?Speech:  Clear and Coherent and Normal Rate  ?Volume:   Normal  ?Mood:  Euthymic  ?Affect:  Appropriate and Congruent  ?Thought Process:  Coherent and Descriptions of Associations: Intact  ?Orientation:  Full (Time, Place, and Person)  ?Thought Content: Logical   ?Suicidal Thoughts:  No  ?Homicidal Thoughts:  No  ?Memory:  WNL  ?Judgement:  Good  ?Insight:  Good  ?Psychomotor Activity:  Normal  ?Concentration:  Concentration: Good  ?Recall:  Good  ?Fund of Knowledge: Good  ?Language: Good  ?Assets:  Communication Skills ?Desire for Improvement ?Financial Resources/Insurance ?Housing ?Intimacy ?Leisure Time ?Physical Health ?Resilience ?Social Support ?Talents/Skills ?Transportation ?Vocational/Educational  ?ADL's:  Intact  ?Cognition: WNL  ?Prognosis:  Good  ? ?  Screenings: MDQ ? ?Receiving Psychotherapy: No  ? ?Treatment Plan/Recommendations: ? ?Plan: ? ?PDMP reviewed ? ?Abilify 10 mg daily ?Increase Trazodone 50mg  to 100 mg ?Restart Buspar 5mg  TID ?Duloxetine 60 mg daily ? ?RTC 4 weeks ? ?Time spent with patient was 60 minutes. Greater than 50% of face to face time with patient was spent on counseling and coordination of care.   ? ?Patient advised to contact office with any questions, adverse effects, or acute worsening in signs and symptoms. ?  ?Discussed potential metabolic side effects associated with atypical antipsychotics, as well as potential risk for movement side effects. Advised pt to contact office if movement side effects occur.   ? ? ? ? , NP ? ?         ?

## 2022-03-19 ENCOUNTER — Ambulatory Visit (INDEPENDENT_AMBULATORY_CARE_PROVIDER_SITE_OTHER): Payer: Medicare HMO | Admitting: Psychiatry

## 2022-03-19 DIAGNOSIS — F3181 Bipolar II disorder: Secondary | ICD-10-CM
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
## 2022-03-26 ENCOUNTER — Ambulatory Visit (INDEPENDENT_AMBULATORY_CARE_PROVIDER_SITE_OTHER): Payer: Medicare HMO | Admitting: Psychiatry

## 2022-03-26 DIAGNOSIS — F3181 Bipolar II disorder: Secondary | ICD-10-CM
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
## 2022-04-01 ENCOUNTER — Telehealth: Payer: Self-pay | Admitting: Cardiology

## 2022-04-01 ENCOUNTER — Other Ambulatory Visit: Payer: Self-pay

## 2022-04-01 NOTE — Telephone Encounter (Signed)
I do not see that you prescribed this pts amlodipine, okay to refill?

## 2022-04-01 NOTE — Telephone Encounter (Signed)
Patient requesting refill for amlodipine

## 2022-04-07 ENCOUNTER — Other Ambulatory Visit: Payer: Self-pay

## 2022-04-07 MED ORDER — AMLODIPINE BESYLATE 10 MG PO TABS: 10.0000 mg | ORAL_TABLET | Freq: Every day | ORAL | 0 refills | Status: DC

## 2022-04-07 NOTE — Telephone Encounter (Signed)
Yes 30days  ? ?ST

## 2022-04-07 NOTE — Telephone Encounter (Signed)
Refill has been sent.  °

## 2022-04-08 ENCOUNTER — Emergency Department (HOSPITAL_BASED_OUTPATIENT_CLINIC_OR_DEPARTMENT_OTHER): Payer: Medicare HMO

## 2022-04-08 ENCOUNTER — Ambulatory Visit: Payer: Medicare HMO | Admitting: Adult Health

## 2022-04-08 ENCOUNTER — Other Ambulatory Visit: Payer: Self-pay

## 2022-04-08 ENCOUNTER — Emergency Department (HOSPITAL_BASED_OUTPATIENT_CLINIC_OR_DEPARTMENT_OTHER)
Admission: EM | Admit: 2022-04-08 | Discharge: 2022-04-08 | Disposition: A | Discharge status: Home / Self Care | Payer: Medicare HMO | Attending: Emergency Medicine | Admitting: Emergency Medicine

## 2022-04-08 ENCOUNTER — Emergency Department: Payer: Medicare HMO

## 2022-04-08 ENCOUNTER — Encounter (HOSPITAL_BASED_OUTPATIENT_CLINIC_OR_DEPARTMENT_OTHER): Payer: Self-pay

## 2022-04-08 DIAGNOSIS — K5900 Constipation, unspecified: Secondary | ICD-10-CM | POA: Diagnosis present

## 2022-04-08 LAB — COMPREHENSIVE METABOLIC PANEL
ALT: 15 U/L (ref 0–44)
AST: 18 U/L (ref 15–41)
Albumin: 4.2 g/dL (ref 3.5–5.0)
Alkaline Phosphatase: 73 U/L (ref 38–126)
Anion gap: 6 (ref 5–15)
BUN: 22 mg/dL (ref 8–23)
CO2: 29 mmol/L (ref 22–32)
Calcium: 10.2 mg/dL (ref 8.9–10.3)
Chloride: 104 mmol/L (ref 98–111)
Creatinine, Ser: 1.12 mg/dL — ABNORMAL HIGH (ref 0.44–1.00)
GFR, Estimated: 55 mL/min — ABNORMAL LOW (ref >60)
Glucose, Bld: 83 mg/dL (ref 70–99)
Potassium: 3.9 mmol/L (ref 3.5–5.1)
Sodium: 139 mmol/L (ref 135–145)
Total Bilirubin: 0.3 mg/dL (ref 0.3–1.2)
Total Protein: 7.1 g/dL (ref 6.5–8.1)

## 2022-04-08 LAB — CBC
HCT: 38.4 % (ref 36.0–46.0)
Hemoglobin: 12.8 g/dL (ref 12.0–15.0)
MCH: 30.4 pg (ref 26.0–34.0)
MCHC: 33.3 g/dL (ref 30.0–36.0)
MCV: 91.2 fL (ref 80.0–100.0)
Platelets: 249 10*3/uL (ref 150–400)
RBC: 4.21 MIL/uL (ref 3.87–5.11)
RDW: 13.3 % (ref 11.5–15.5)
WBC: 9.6 10*3/uL (ref 4.0–10.5)
nRBC: 0 % (ref 0.0–0.2)

## 2022-04-08 LAB — URINALYSIS, ROUTINE W REFLEX MICROSCOPIC
Bilirubin Urine: NEGATIVE
Glucose, UA: NEGATIVE mg/dL
Ketones, ur: NEGATIVE mg/dL
Nitrite: NEGATIVE
Protein, ur: NEGATIVE mg/dL
Specific Gravity, Urine: 1.019 (ref 1.005–1.030)
pH: 5.5 (ref 5.0–8.0)

## 2022-04-08 LAB — LIPASE, BLOOD: Lipase: 17 U/L (ref 11–51)

## 2022-04-08 MED ORDER — GLYCERIN (ADULT) 2 G RE SUPP: 1.0000 | RECTAL | 0 refills | Status: DC | PRN

## 2022-04-08 MED ORDER — MINERAL OIL RE ENEM: 1.0000 | ENEMA | Freq: Once | RECTAL | 0 refills | Status: AC

## 2022-04-08 MED ORDER — SODIUM CHLORIDE 0.9 % IV BOLUS
1000.0000 mL | Freq: Once | INTRAVENOUS | Status: AC
  Administered 2022-04-08: 1000 mL via INTRAVENOUS

## 2022-04-08 MED ORDER — IOHEXOL 300 MG/ML  SOLN
100.0000 mL | Freq: Once | INTRAMUSCULAR | Status: AC | PRN
  Administered 2022-04-08: 80 mL via INTRAVENOUS

## 2022-04-08 NOTE — ED Notes (Signed)
Pt now returned from CT dept - remains awake and alert; no acute changes noted.   ?

## 2022-04-08 NOTE — ED Triage Notes (Signed)
Patient here POV from Home with ABD Pain. ? ?Endorses not having a BM for 10 Days (Normal is 1 BM approximately Daily). Associated with MID ABD Pain. Sent by PCP for Imaging. ? ?No Fevers. No 1 Occurrence of Nausea and Emesis last Week.  ? ?NAD Noted during Triage. A&Ox4. GCS 15. Ambulatory. ?

## 2022-04-08 NOTE — ED Notes (Signed)
Patient transported to CT 

## 2022-04-08 NOTE — ED Provider Notes (Signed)
Chest pain ?[facility] EMERGENC[facility] DEPT ?Provider Note ? ? ?CSN: [id number] ?Arrival date & time: 04/08/22  1724 ? ?  ? ?History ? ?Chief Complaint  ?Patient presents with  ? Constipation  ? ? ?Desiree Torres is a 66 y.o. female. ? ? ?Constipation ? ?Patient is a 66 year old female with past medical history significant for intestinal volvulus, hernia status postrepair ? ?She is presented emergency room today for 10 days of passing 0 bowel movements.  She states she is still passing flatus.  She states that she has some crampy abdominal pain that is mid abdomen.  She states no nausea or vomiting today but did have multiple episodes of nonbloody nonbilious emesis 5 days ago after eating a cheese steak. ? ?She denies any nausea vomiting lightheadedness dizziness.  She denies any cough congestion weakness or fatigue.  Abdominal trauma.  No other associate symptoms. She has tried Dulcolax without improvement in her symptoms. ?  ? ?Home Medications ?Prior to Admission medications   ?Medication Sig Start Date End Date Taking? Authorizing Provider  ?glycerin adult 2 g suppository Place 1 suppository rectally as needed for constipation. 04/08/22  Yes Rissa Turley, Stevphen Meuse S, PA  ?mineral oil enema Place 133 mLs (1 enema total) rectally once for 1 dose. 04/08/22 04/08/22 Yes Sawyer Kahan, Stevphen Meuse S, PA  ?amLODipine (NORVASC) 10 MG tablet Take 1 tablet (10 mg total) by mouth daily. 04/07/22   Tessa Lerner, DO  ?ARIPiprazole (ABILIFY) 10 MG tablet Take 1 tablet (10 mg total) by mouth once for 1 dose. 03/11/22 03/11/22  Mozingo, Thereasa Solo, NP  ?BUPROPION HBR ER PO Take by mouth.    [provider]  ?busPIRone (BUSPAR) 5 MG tablet Take 1 tablet (5 mg total) by mouth 3 (three) times daily. 03/11/22   Mozingo, Thereasa Solo, NP  ?DULoxetine (CYMBALTA) 60 MG capsule Take 1 capsule (60 mg total) by mouth daily. 03/11/22   Mozingo, Thereasa Solo, NP  ?folic acid (FOLVITE) 1 MG tablet Take 1 mg by mouth daily. 09/16/21    [provider]  ?methylPREDNISolone (MEDROL DOSEPAK) 4 MG TBPK tablet Take as directed 12/03/21   Louann Sjogren, MD  ?Multiple Vitamin (MULTIVITAMIN) capsule Take 1 capsule by mouth daily.    [provider]  ?omeprazole (PRILOSEC) 40 MG capsule Take 40 mg by mouth every morning. 12/21/21   [provider]  ?telmisartan-hydrochlorothiazide (MICARDIS HCT) 80-12.5 MG tablet  05/25/21   [provider]  ?timolol (TIMOPTIC) 0.5 % ophthalmic solution SMARTSIG:In Eye(s) 08/01/21   [provider]  ?traZODone (DESYREL) 100 MG tablet Take 2-3 tablets at bedtime for sleep. 03/11/22   Mozingo, Thereasa Solo, NP  ?   ? ?Allergies    ?Pork allergy, Pork-derived products, Ham, and Morphine   ? ?Review of Systems   ?Review of Systems  ?Gastrointestinal:  Positive for constipation.  ? ?Physical Exam ?Updated Vital Signs ?BP 125/73   Pulse 68   Temp 99 ?F (37.2 ?C) (Oral)   Resp 17   Ht 5\' 5"  (1.651 m)   Wt 65.3 kg   SpO2 100%   BMI 23.96 kg/m?  ?Physical Exam ?Vitals and nursing note reviewed.  ?Constitutional:   ?   General: She is not in acute distress. ?HENT:  ?   Head: Normocephalic and atraumatic.  ?   Nose: Nose normal.  ?Eyes:  ?   General: No scleral icterus. ?Cardiovascular:  ?   Rate and Rhythm: Normal rate and regular rhythm.  ?   Pulses: Normal pulses.  ?  Heart sounds: Normal heart sounds.  ?Pulmonary:  ?   Effort: Pulmonary effort is normal. No respiratory distress.  ?   Breath sounds: No wheezing.  ?Abdominal:  ?   Palpations: Abdomen is soft.  ?   Tenderness: There is abdominal tenderness.  ?   Comments: Mild umbilical tenderness with deep palpation  ?Musculoskeletal:  ?   Cervical back: Normal range of motion.  ?   Right lower leg: No edema.  ?   Left lower leg: No edema.  ?Skin: ?   General: Skin is warm and dry.  ?   Capillary Refill: Capillary refill takes less than 2 seconds.  ?Neurological:  ?   Mental Status: She is alert. Mental status is at baseline.   ?Psychiatric:     ?   Mood and Affect: Mood normal.     ?   Behavior: Behavior normal.  ? ? ?ED Results / Procedures / Treatments   ?Labs ?(all labs ordered are listed, but only abnormal results are displayed) ?Labs Reviewed  ?COMPREHENSIVE METABOLIC PANEL - Abnormal; Notable for the following components:  ?    Result Value  ? Creatinine, Ser 1.12 (*)   ? GFR, Estimated 55 (*)   ? All other components within normal limits  ?URINALYSIS, ROUTINE W REFLEX MICROSCOPIC - Abnormal; Notable for the following components:  ? Hgb urine dipstick TRACE (*)   ? Leukocytes,Ua SMALL (*)   ? All other components within normal limits  ?LIPASE, BLOOD  ?CBC  ? ? ?EKG ?None ? ?Radiology ?No results found. ? ?Procedures ?Procedures  ? ? ?Medications Ordered in ED ?Medications  ?sodium chloride 0.9 % bolus 1,000 mL (0 mLs Intravenous Stopped 04/08/22 2207)  ?iohexol (OMNIPAQUE) 300 MG/ML solution 100 mL (80 mLs Intravenous Contrast Given 04/08/22 2006)  ? ? ?ED Course/ Medical Decision Making/ A&P ?  ? ?                        ?Medical Decision Making ?Amount and/or Complexity of Data Reviewed ?Labs: ordered. ?Radiology: ordered. ? ?Risk ?Prescription drug management. ? ? ?This patient presents to the ED for concern of abdominal pain/constipation, this involves a number of treatment options, and is a complaint that carries with it a moderate risk of complications and morbidity.  The differential diagnosis includes The causes of generalized abdominal pain include but are not limited to AAA, mesenteric ischemia, appendicitis, diverticulitis, DKA, gastritis, gastroenteritis, AMI, nephrolithiasis, pancreatitis, peritonitis, adrenal insufficiency,lead poisoning, iron toxicity, intestinal ischemia, constipation, UTI,SBO/LBO, splenic rupture, biliary disease, IBD, IBS, PUD, or hepatitis. ? ? ? ?Co morbidities: ?Discussed in HPI ? ? ?Brief History: ? ?Patient is a 66 year old female with past medical history significant for intestinal volvulus,  hernia status postrepair ? ?She is presented emergency room today for 10 days of passing 0 bowel movements.  She states she is still passing flatus.  She states that she has some crampy abdominal pain that is mid abdomen.  She states no nausea or vomiting today but did have multiple episodes of nonbloody nonbilious emesis 5 days ago after eating a cheese steak. ? ?She denies any nausea vomiting lightheadedness dizziness.  She denies any cough congestion weakness or fatigue.  Abdominal trauma.  No other associate symptoms. She has tried Dulcolax without improvement in her symptoms. ? ? ? ?EMR reviewed including pt PMHx, past surgical history and past visits to ER.  ? ?See HPI for more details ? ? ?Lab Tests: ? ? ?I personally  reviewed all laboratory work and imaging. Metabolic panel without any acute abnormality specifically kidney function within normal limits and no significant electrolyte abnormalities. CBC without leukocytosis or significant anemia. ? ? ?Imaging Studies: ? ?Abnormal findings. I personally reviewed all imaging studies. Imaging notable for ?I personally reviewed CT images.  Of note patient does have stool burden however this was not severe.  No rectal stool ball. ?Per radiology report there is trace free fluid in the pelvis this is a patient without any abdominal trauma who is 66 years old. ?She is not peritonitic and is primarily complaining of constipation and I do not suspect that this free fluid is blood. ? ?CT reviewed and without other abn findings.  ? ? ?Cardiac Monitoring: ? ?NA ?NA ? ? ?Medicines ordered: ? ?I ordered medication including 1 L normal saline for dehydration ?Reevaluation of the patient after these medicines showed that the patient stayed the same ?I have reviewed the patients home medicines and have made adjustments as needed ? ? ?Critical Interventions: ? ? ? ? ?Consults/Attending Physician ? ? ? ? ? ?Reevaluation: ? ?After the interventions noted above I re-evaluated  patient and found that they have :stayed the same ? ? ?Social Determinants of Health: ? ?The patient's social determinants of health were a factor in the care of this patient ? ? ? ?Problem List / ED Course: ? ?Constipation ?

## 2022-04-08 NOTE — ED Notes (Signed)
Pt agreeable with d/c plan as discussed by provider- this nurse has verbally reinforced d/c instructions and provided pt with written copy- pt acknowledges verbal understanding and denies any additional questions, concerns, needs- pt ambulatory at discharge independently with steady gait; no distress.  Escorted home by family friend  ?

## 2022-04-08 NOTE — Discharge Instructions (Addendum)
I recommend drinking plenty of water 1.5 L of water per day is a good place issue for her.  In terms of fiber you can follow the instructions below I recommend starting with a capful of MiraLAX in beverage of your choice twice daily you can titrate up from here.  Please make sure you are drinking lots of water.  Continue to exercise and increase your exercise.  You may continue to use docusate/Colace 100 mg twice daily. ? ?I have also written you a prescription for an enema to use at home.  Please be aware that after you use this you may have a large bowel movement. This is a larger enema than the over the counter enemas. You may first try the OTC enemas and use the mineral oil enema if no relief. I have also written a prescription for a glycerin suppository. This is a less intense way to help clean you out.  ? ?Please call follow-up with the gastroenterologist.  If you do not have 1 please follow-up with one of the gastroenterology clinics I have given you the information for. ? ? ?GETTING TO [facility]. ?Irregular bowel habits such as constipation and diarrhea can lead to many problems over time.  Having one soft bowel movement a day is the most important way to prevent further problems.  The anorectal canal is designed to handle stretching and feces to safely manage our ability to get rid of solid waste (feces, poop, stool) out of our body.  BUT, hard constipated stools can act like ripping concrete bricks and diarrhea can be a burning fire to this very sensitive area of our body, causing inflamed hemorrhoids, anal fissures, increasing risk is perirectal abscesses, abdominal pain/bloating, an making irritable bowel worse.     ?The goal: ONE SOFT BOWEL MOVEMENT A DAY!  To have soft, regular bowel movements:  ?Drink at least 8 tall glasses of water a day.   ?Take plenty of fiber.  Fiber is the undigested part of plant food that passes into the colon, acting s ?natures broom? to encourage bowel motility and  movement.  Fiber can absorb and hold large amounts of water. This results in a larger, bulkier stool, which is soft and easier to pass. Work gradually over several weeks up to 6 servings a day of fiber (25g a day even more if needed) in the form of: ?Vegetables -- Root (potatoes, carrots, turnips), leafy green (lettuce, salad greens, celery, spinach), or cooked high residue (cabbage, broccoli, etc) ?Fruit -- Fresh (unpeeled skin & pulp), Dried (prunes, apricots, cherries, etc ),  or stewed ( applesauce)  ?Whole grain breads, pasta, etc (whole wheat)  ?Bran cereals  ?Bulking Agents -- This type of water-retaining fiber generally is easily obtained each day by one of the following:  ?Psyllium bran -- The psyllium plant is remarkable because its ground seeds can retain so much water. This product is available as Metamucil, Konsyl, Effersyllium, Per Diem Fiber, or the less expensive generic preparation in drug and health food stores. Although labeled a laxative, it really is not a laxative.  ?Methylcellulose -- This is another fiber derived from wood which also retains water. It is available as Citrucel. ?Polyethylene Glycol - and ?artificial? fiber commonly called Miralax or Glycolax.  It is helpful for people with gassy or bloated feelings with regular fiber ?Flax Seed - a less gassy fiber than psyllium ?No reading or other relaxing activity while on the toilet. If bowel movements take longer than 5 minutes,  you are too constipated ?AVOID CONSTIPATION.  High fiber and water intake usually takes care of this.  Sometimes a laxative is needed to stimulate more frequent bowel movements, but  ?Laxatives are not a good long-term solution as it can wear the colon out. ?Osmotics (Milk of Magnesia, Fleets phosphosoda, Magnesium citrate, MiraLax, GoLytely) are safer than  ?Stimulants (Senokot, Castor Oil, Dulcolax, Ex Lax)    ?Do not take laxatives for more than 7days in a row. ? IF SEVERELY CONSTIPATED, try a Bowel Retraining  Program: ?Do not use laxatives.  ?Eat a diet high in roughage, such as bran cereals and leafy vegetables.  ?Drink six (6) ounces of prune or apricot juice each morning.  ?Eat two (2) large servings of stewed fruit each day.  ?Take one (1) heaping tablespoon of a psyllium-based bulking agent twice a day. Use sugar-free sweetener when possible to avoid excessive calories.  ?Eat a normal breakfast.  ?Set aside 15 minutes after breakfast to sit on the toilet, but do not strain to have a bowel movement.  ?If you do not have a bowel movement by the third day, use an enema and repeat the above steps.  ?Controlling diarrhea ?Switch to liquids and simpler foods for a few days to avoid stressing your intestines further. ?Avoid dairy products (especially milk & ice cream) for a short time.  The intestines often can lose the ability to digest lactose when stressed. ?Avoid foods that cause gassiness or bloating.  Typical foods include beans and other legumes, cabbage, broccoli, and dairy foods.  Every person has some sensitivity to other foods, so listen to our body and avoid those foods that trigger problems for you. ?Adding fiber (Citrucel, Metamucil, psyllium, Miralax) gradually can help thicken stools by absorbing excess fluid and retrain the intestines to act more normally.  Slowly increase the dose over a few weeks.  Too much fiber too soon can backfire and cause cramping & bloating. ?Probiotics (such as active yogurt, Align, etc) may help repopulate the intestines and colon with normal bacteria and calm down a sensitive digestive tract.  Most studies show it to be of mild help, though, and such products can be costly. ?Medicines: ?Bismuth subsalicylate (ex. Kayopectate, Pepto Bismol) every 30 minutes for up to 6 doses can help control diarrhea.  Avoid if pregnant. ?Loperamide (Immodium) can slow down diarrhea.  Start with two tablets (4mg  total) first and then try one tablet every 6 hours.  Avoid if you are having fevers  or severe pain.  If you are not better or start feeling worse, stop all medicines and call your doctor for advice ?Call your doctor if you are getting worse or not better.  Sometimes further testing (cultures, endoscopy, X-ray studies, bloodwork, etc) may be needed to help diagnose and treat the cause of the diarrhea. ? ?Managing Pain ? ?Pain after surgery or related to activity is often due to strain/injury to muscle, tendon, nerves and/or incisions.  This pain is usually short-term and will improve in a few months.  ? ?Many people find it helpful to do the following things TOGETHER to help speed the process of healing and to get back to regular activity more quickly: ? ?Avoid heavy physical activity ? no lifting greater than 20 pounds ?Do not ?push through? the pain.  Listen to your body and avoid positions and maneuvers than reproduce the pain ?Walking is okay as tolerated, but go slowly and stop when getting sore.  ?Remember: If it hurts to do it,  then don?t do it! ?Take Anti-inflammatory medication  ?Take with food/snack around the clock for 1-2 weeks ?This helps the muscle and nerve tissues become less irritable and calm down faster ?Choose ONE of the following over-the-counter medications: ?Naproxen 220mg  tabs (ex. Aleve) 1-2 pills twice a day  ?Ibuprofen 200mg  tabs (ex. Advil, Motrin) 3-4 pills with every meal and just before bedtime ?Acetaminophen 500mg  tabs (Tylenol) 1-2 pills with every meal and just before bedtime ?Use a Heating pad or Ice/Cold Pack ?4-6 times a day ?May use warm bath/hottub  or showers ?Try Gentle Massage and/or Stretching  ?at the area of pain many times a day ?stop if you feel pain - do not overdo it ? ?Try these steps together to help you body heal faster and avoid making things get worse.  Doing just one of these things may not be enough.   ? ?If you are not getting better after two weeks or are noticing you are getting worse, contact our office for further advice; we may need to  re-evaluate you & see what other things we can do to help. ? ? ? ? ?

## 2022-04-14 ENCOUNTER — Ambulatory Visit (INDEPENDENT_AMBULATORY_CARE_PROVIDER_SITE_OTHER): Payer: Medicare HMO | Admitting: Psychiatry

## 2022-04-14 DIAGNOSIS — F411 Generalized anxiety disorder: Secondary | ICD-10-CM | POA: Diagnosis not present

## 2022-04-14 NOTE — Progress Notes (Signed)
?    [facility] Counselor/Therapist Progress Note ? ?Patient ID: Desiree Torres, MRN: [id number],   ? ?Date: 04/14/2022 ? ?Time Spent:  58 minuyrd  ? ?Treatment Type: Individual Therapy ? ?Reported Symptoms: anxiety, depression ? ?Mental Status Exam: ? ?Appearance:   Casual     ?Behavior:  Appropriate  ?Motor:  Normal  ?Speech/Language:   Clear and Coherent  ?Affect:  Depressed and anxious  ?Mood:  anxious and depressed  ?Thought process:  goal directed  ?Thought content:    overthinking  ?Sensory/Perceptual disturbances:    WNL  ?Orientation:  oriented to person, place, time/date, situation, day of week, month of year, year, and stated date of April 14, 2022  ?Attention:  Good  ?Concentration:  Good and Fair  ?Memory:  WNL  ?Fund of knowledge:   Good  ?Insight:    Good and Fair  ?Judgment:   Good  ?Impulse Control:  Fair  ? ?Risk Assessment: ?Danger to Self:  No ?Self-injurious Behavior: No ?Danger to Others: No ?Duty to Warn:no ?Physical Aggression / Violence:No  ?Access to Firearms a concern: No  ?Gang Involvement:No  ? ?Subjective:  Patient today reporting anxiety and depression mostly related to  recent health issue with her colon (has had prior problems in [place]) in 2020. Recent episode and is checking back with her doctor in [place] in May. Feels her anxiety and depression and worrying may be affecting her health in some ways. Discussed her need to forgive her separated husband "for how he left me and the hurtful/deceptive things he did."  This was patient's 3rd marriage. "He texts me some, pretty often and I do respond but it's hurting me and not helping me." Wants to stop this unhealthy cycle and plans to let him know not to contact her and she will no longer be responding to him. That relationship has affected her in other relationships and began isolating more. "And when I isolate, I think negatively too much." Was able Continued work on trust issues within family, including her sister. Currently taking another  online class in criminal justice and has a total of 80 hours towards her college degree, "I've never graduated and this has been a goal of mine."  Wanting to be able to peacefully put some distance between her and separated husband and discussed this more at length today.  States she also is working to "not be so hard on myself" and being able to say "no" to people when she needs to do so.  Acknowledges that "I get a lot going on in my head but I try to think before I speak and not act impulsively which works better in relationships".  "I know I need to continue working on not being so impulsive" and will work with her more on this at future sessions.  Trying to allow for better sleep patterns ? ?Interventions: Solution-Oriented/Positive Psychology, Ego-Supportive, and Insight-Oriented ? ?Treatment goal plan: ?Patient not signing treatment plan on computer screen due to [place]. ?Treatment goals: ?Treatment goals remain on treatment plan as patient works with strategies to achieve her goals. Progress is assessed each session and documented" subject" and/or "plan" sections of treatment note. ?Long-term goal: ?Elevate mood and show evidence of usual energy, activities, and socialization. ?Short-term goal: ?Verbalize and understanding of the relationship between repressed anger and depressed mood. ?Strategies: ?Verbalize hopeful and positive statements regarding the future. ? ?Diagnosis: ?  ICD-10-CM   ?1. GAD (generalized anxiety disorder)  F41.1   ?  ? ?Plan:  Patient  today showing good motivation and participation in session as she focused on her concerns for reoccurring health issues, the unhelpful contacts from separated husband and needing to set boundaries in that situation, forgiveness issues, not being so hard on herself and being able to say no when she needs to say no without feeling guilty.  Very motivated today and participated quite well in session.  Continue to work on not isolating herself from others as  that feeds her depression and anxiety when she is isolated.  Also trying to work on not assuming that everybody is not trustworthy as she is trying to make friends. Encouraged patient in practicing more positive behaviors including: Staying in the present focusing on what she can control or change, getting outside daily to walk, staying in touch with people who are supportive, look for more positives versus negatives each day, remain on her prescribed medication, allowing for healthier sleep patterns, healthy nutrition and exercise, stop assuming negatives and worst case scenarios, practice more positive self talk, consider using daily affirmations, stop self negating, limit time on her electronics daily, reduce overthinking and over analyzing, challenge and counter herself doubt, allow her faith to be an emotional support as well as spiritual, interrupt negative/anxious thoughts to challenge them and replace with more reality-based thoughts, practice saying no when she needs to say no, letting go of things including guilt from the past that holds her back as discussed in session, continue work on her control issues, and feel good about the strength she shows when working with goal-directed behaviors to move in a direction that supports her improved emotional health and self-confidence. ? ?Review and progress/challenges noted with patient. ? ?Next appointment within 2 to 3 weeks. ? ?This record has been created using [employer].  Chart creation errors have been sought, but may not always have been located and corrected.  Such creation errors do not reflect on the standard of medical care provided. ? ? ?[name], LCSW ? ? ? ? ? ? ? ? ? ? ? ? ? ? ? ? ? ? ?

## 2022-04-16 ENCOUNTER — Ambulatory Visit: Payer: Medicare HMO | Admitting: Adult Health

## 2022-04-20 ENCOUNTER — Encounter: Payer: Self-pay | Admitting: Cardiology

## 2022-04-20 ENCOUNTER — Ambulatory Visit: Payer: Medicare HMO | Admitting: Cardiology

## 2022-04-20 VITALS — BP 111/66 | HR 74 | Temp 98.0°F | Resp 16 | Ht 65.0 in | Wt 147.2 lb

## 2022-04-20 DIAGNOSIS — F17210 Nicotine dependence, cigarettes, uncomplicated: Secondary | ICD-10-CM

## 2022-04-20 DIAGNOSIS — I10 Essential (primary) hypertension: Secondary | ICD-10-CM

## 2022-04-20 DIAGNOSIS — I3139 Other pericardial effusion (noninflammatory): Secondary | ICD-10-CM

## 2022-04-20 DIAGNOSIS — F1290 Cannabis use, unspecified, uncomplicated: Secondary | ICD-10-CM

## 2022-04-20 MED ORDER — NICOTINE 14 MG/24HR TD PT24: 14.0000 mg | MEDICATED_PATCH | Freq: Every day | TRANSDERMAL | 0 refills | Status: DC

## 2022-04-20 MED ORDER — NICOTINE POLACRILEX 2 MG MT LOZG: 2.0000 mg | LOZENGE | OROMUCOSAL | 0 refills | Status: DC | PRN

## 2022-04-20 MED ORDER — AMLODIPINE BESYLATE 10 MG PO TABS: 10.0000 mg | ORAL_TABLET | Freq: Every day | ORAL | 0 refills | Status: DC

## 2022-04-20 NOTE — Progress Notes (Signed)
? ?ID:  Desiree Torres, DOB September 08, 1956, MRN [id number] ? 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  ? ?Date: 04/20/22 ?Last Office Visit: 10/24/2021 ? ?Chief Complaint  ?Patient presents with  ? Results  ? Follow-up  ? ? ?HPI  ?Desiree Torres is a 66 y.o. African-American female whose past medical history and cardiovascular risk factors include: Hypertension, hyperlipidemia, bipolar, Depression, cigarette smoking, marijuana use, postmenopausal, advance age.  ? ?Patient was a self-referral to the practice for evaluation of fluid around the heart which was originally diagnosed while she was in [place].  She moved to [facility] in August 2022 and wanted to establish care with cardiology.  Since there is no additional details available outside records were requested an echocardiogram was performed. ? ?Most recent echocardiogram results reviewed independently with the patient at today's visit and noted below for further reference.  In summary LVEF is preserved and she has a small pericardial effusion without any hemodynamically significant consequence. ? ?Since last office visit she is doing well from a cardiovascular standpoint.  Denies angina pectoris or heart failure symptoms.  Continues to smoke 0.5 packs/day.  She is requesting refills on her blood pressure medications. ? ?FUNCTIONAL STATUS: ?She states that every other day she walks 2 miles.   ? ?ALLERGIES: ?Allergies  ?Allergen Reactions  ? Pork Allergy   ?  Other reaction(s): GI Upset (intolerance)  ? Pork-Derived Products   ?  Other reaction(s): GI Upset  ? Ham   ?  Other reaction(s): GI Upset  ? Morphine Hives and Itching  ?  hives ?hives ?hives ?hives ?  ? ? ?MEDICATION LIST PRIOR TO VISIT: ?Current Meds  ?Medication Sig  ? ARIPiprazole (ABILIFY) 10 MG tablet Take 1 tablet (10 mg total) by mouth once for 1 dose.  ? BUPROPION HBR ER PO Take by  mouth.  ? busPIRone (BUSPAR) 5 MG tablet Take 1 tablet (5 mg total) by mouth 3 (three) times daily.  ? DULoxetine (CYMBALTA) 60 MG capsule Take 1 capsule (60 mg total) by mouth daily.  ? Multiple Vitamin (MULTIVITAMIN) capsule Take 1 capsule by mouth daily.  ? nicotine (NICODERM CQ - DOSED IN MG/24 HOURS) 14 mg/24hr patch Place 1 patch (14 mg total) onto the skin daily.  ? nicotine polacrilex (COMMIT) 2 MG lozenge Take 1 lozenge (2 mg total) by mouth as needed for smoking cessation.  ? omeprazole (PRILOSEC) 40 MG capsule Take 40 mg by mouth every morning.  ? telmisartan-hydrochlorothiazide (MICARDIS HCT) 80-12.5 MG tablet   ? timolol (TIMOPTIC) 0.5 % ophthalmic solution SMARTSIG:In Eye(s)  ? traZODone (DESYREL) 100 MG tablet Take 2-3 tablets at bedtime for sleep.  ? [DISCONTINUED] amLODipine (NORVASC) 10 MG tablet Take 1 tablet (10 mg total) by mouth daily.  ?  ? ?PAST MEDICAL HISTORY: ?Past Medical History:  ?Diagnosis Date  ? Bipolar depression (HCC)   ? Hypertension   ? ? ?PAST SURGICAL HISTORY: ?History reviewed. No pertinent surgical history. ? ?FAMILY HISTORY: ?The patient family history includes Cancer in her father; Heart disease in her mother and sister. ? ?SOCIAL HISTORY:  ?The patient  reports that she has been smoking cigarettes. She has a 6.25 pack-year smoking history. She has never used smokeless tobacco. She reports current alcohol use. She reports current drug use. Drug: Marijuana. ? ?REVIEW OF SYSTEMS: ?Review of Systems  ?Cardiovascular:  Negative for chest pain, claudication, dyspnea on exertion, leg swelling,  near-syncope, orthopnea, palpitations, paroxysmal nocturnal dyspnea and syncope.  ?Respiratory:  Negative for shortness of breath.   ? ?PHYSICAL EXAM: ? ?  04/20/2022  ?  9:41 AM 04/08/2022  ? 10:00 PM 04/08/2022  ?  9:15 PM  ?Vitals with BMI  ?Height 5\' 5"     ?Weight 147 lbs 3 oz    ?BMI 24.5    ?Systolic 111 136  ?Diastolic 66 104 73  ?Pulse 74 61 68  ? ? ?CONSTITUTIONAL: Well-developed  and well-nourished. No acute distress.  ?SKIN: Skin is warm and dry. No rash noted. No cyanosis. No pallor. No jaundice ?HEAD: Normocephalic and atraumatic.  ?EYES: No scleral icterus ?MOUTH/THROAT: Moist oral membranes.  ?NECK: No JVD present. No thyromegaly noted. No carotid bruits  ?LYMPHATIC: No visible cervical adenopathy.  ?CHEST Normal respiratory effort. No intercostal retractions  ?LUNGS: Decreased breath sounds bilaterally.  No stridor. No wheezes. No rales.  ?CARDIOVASCULAR: Regular rate and rhythm, positive S1-S2, no murmurs rubs or gallops appreciated. ?ABDOMINAL:  No apparent ascites.  ?EXTREMITIES: No peripheral edema, warm to touch, ?HEMATOLOGIC: No significant bruising ?NEUROLOGIC: Oriented to person, place, and time. Nonfocal. Normal muscle tone.  ?PSYCHIATRIC: Normal mood and affect. Normal behavior. Cooperative ? ?CARDIAC DATABASE: ?EKG: ?04/20/2022: Normal sinus rhythm, 80 bpm, normal axis, LAE, consider old anteroseptal infarct, without underlying injury pattern. ? ?Echocardiogram: ?11/05/2021:    ?1. Normal LV systolic function with visual EF 60-65%. Left ventricle cavity is normal in size. Normal left  ?ventricular wall thickness. Normal global wall motion. Normal diastolic filling pattern, normal LAP.  ?2. Mild tricuspid regurgitation. No evidence of pulmonary hypertension.  ?3. Small pericardial effusion. There is no hemodynamic significance.  ?4. No prior study for comparison.  ? ?Stress Testing: ?No results found for this or any previous visit from the past 1095 days. ? ? ?Heart Catheterization: ?None ? ?LABORATORY DATA: ? ?  Latest Ref Rng & Units 04/08/2022  ?  5:37 PM  ?CBC  ?WBC 4.0 - 10.5 K/uL 9.6    ?Hemoglobin 12.0 - 15.0 g/dL 04/10/2022    ?Hematocrit 36.0 - 46.0 % 38.4    ?Platelets 150 - 400 K/uL 249    ? ? ?  Latest Ref Rng & Units 04/08/2022  ?  5:37 PM  ?CMP  ?Glucose 70 - 99 mg/dL 83    ?BUN 8 - 23 mg/dL 22    ?Creatinine 0.44 - 1.00 mg/dL 04/10/2022    ?Sodium 135 - 145 mmol/L 139     ?Potassium 3.5 - 5.1 mmol/L 3.9    ?Chloride 98 - 111 mmol/L 104    ?CO2 22 - 32 mmol/L 29    ?Calcium 8.9 - 10.3 mg/dL 3.71    ?Total Protein 6.5 - 8.1 g/dL 7.1    ?Total Bilirubin 0.3 - 1.2 mg/dL 0.3    ?Alkaline Phos 38 - 126 U/L 73    ?AST 15 - 41 U/L 18    ?ALT 0 - 44 U/L 15    ? ?Lipid Panel  ?No results found for: CHOL, TRIG, HDL, CHOLHDL, VLDL, LDLCALC, LDLDIRECT, LABVLDL ? ?No components found for: NTPROBNP ?No results for input(s): PROBNP in the last 8760 hours. ?No results for input(s): TSH in the last 8760 hours. ? ?BMP ?Recent Labs  ?  04/08/22 ?1737  ?NA 139  ?K 3.9  ?CL 104  ?CO2 29  ?GLUCOSE 83  ?BUN 22  ?CREATININE 1.12*  ?CALCIUM 10.2  ?GFRNONAA 55*  ? ? ?HEMOGLOBIN A1C ?No results found for: HGBA1C,  MPG ? ?External Labs: ?Collected: 12/04/2020 Care Everywhere ?Total cholesterol 224, triglycerides 114, HDL 69, LDL 132, non-HDL 155 ?Hemoglobin A1c 5.4 ?TSH 1.26 ?Sodium 140, potassium 3.5, chloride 107, BUN 15, creatinine 0.92. ?AST 15, ALT 14, alkaline phosphatase 82 ? ? ?IMPRESSION:  ?  ICD-10-CM   ?1. Pericardial effusion  I31.39 PCV ECHOCARDIOGRAM COMPLETE  ?  ?2. Benign hypertension  I10 EKG 12-Lead  ?  amLODipine (NORVASC) 10 MG tablet  ?  ?3. Mixed hyperlipidemia  E78.2   ?  ?4. Cigarette nicotine dependence without complication  F17.210 nicotine (NICODERM CQ - DOSED IN MG/24 HOURS) 14 mg/24hr patch  ?  nicotine polacrilex (COMMIT) 2 MG lozenge  ?  ?5. Marijuana use  F12.90   ?  ?  ? ?RECOMMENDATIONS: ?Desiree Torres is a 66 y.o. African-American female whose past medical history and cardiac risk factors include: Hypertension, hyperlipidemia, bipolar, Depression, cigarette smoking, marijuana use, postmenopausal, advance age.  ? ?Pericardial effusion ?Chronic. ?Asymptomatic. ?Echo 10/2021: Small pericardial effusion. ?Not on any medications that could be contributory to the underlying pericardial effusion. ?We will repeat echo cardiogram in November 2020 for to reevaluate her pericardial  effusion. ?Monitor for now ? ?Benign hypertension ?Office blood pressures are well controlled. ?Amlodipine refilled. ? ?Cigarette nicotine dependence without complication ?Tobacco cessation counseling: ?Currently smok

## 2022-04-23 ENCOUNTER — Ambulatory Visit: Payer: Medicare HMO | Admitting: Psychiatry

## 2022-04-28 ENCOUNTER — Encounter: Payer: Self-pay | Admitting: Adult Health

## 2022-04-28 ENCOUNTER — Ambulatory Visit (INDEPENDENT_AMBULATORY_CARE_PROVIDER_SITE_OTHER): Payer: Medicare HMO | Admitting: Adult Health

## 2022-04-28 DIAGNOSIS — F5105 Insomnia due to other mental disorder: Secondary | ICD-10-CM

## 2022-04-28 DIAGNOSIS — F331 Major depressive disorder, recurrent, moderate: Secondary | ICD-10-CM | POA: Diagnosis not present

## 2022-04-28 DIAGNOSIS — F411 Generalized anxiety disorder: Secondary | ICD-10-CM

## 2022-04-28 DIAGNOSIS — F3181 Bipolar II disorder: Secondary | ICD-10-CM

## 2022-04-28 DIAGNOSIS — F99 Mental disorder, not otherwise specified: Secondary | ICD-10-CM

## 2022-04-28 MED ORDER — DULOXETINE HCL 60 MG PO CPEP: 60.0000 mg | ORAL_CAPSULE | Freq: Every day | ORAL | 5 refills | Status: DC

## 2022-04-28 MED ORDER — BUSPIRONE HCL 5 MG PO TABS: 5.0000 mg | ORAL_TABLET | Freq: Three times a day (TID) | ORAL | 5 refills | Status: DC

## 2022-04-28 MED ORDER — ARIPIPRAZOLE 10 MG PO TABS: ORAL_TABLET | ORAL | 5 refills | Status: DC

## 2022-04-28 NOTE — Progress Notes (Signed)
Desiree Torres ?[id number] ?1956-02-06 ?66 y.o. ? ?Subjective:  ? ?Patient ID:  Desiree Torres is a 65 y.o. (DOB [birth year]) female. ? ?Chief Complaint: No chief complaint on file. ? ? ?HPI ?Desiree Torres presents to the office today for follow-up of GAD, MDD, insomnia and Bipolar disorder. ? ?Describes mood today as "hanging in there". Pleasant. Tearful at times. Mood symptoms - reports depression - a little bit, anxiety - increased, and irritability - increased. Stating "I feel stressed". Reports some worry and rumination. Reports mood inconsistencies - still feeling down. Stating "I have things on mind". Hasn't been taking her medications like she should due to costs. Has been struggling financially. Varying interest and motivation. Taking medications as prescribed. Reports moving to [place] from [place] after she and husband separated.  ?Energy levels lower. Active, does not have a regular exercise routine.   ?Enjoys some usual interests and activities. Lives alone. Separated from husband of 2 years. Has 4 grown children. Moved [place] - family local. Spending time with family. ?Appetite decreased. Weight stable 144 pounds - 65". ?Sleeps well most nights. Averages 3 to 4 hours - mind racing. ?Focus and concentration stable. Completing tasks. Managing aspects of household. 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 Works part time - 3 hours at an [employer] every day. ?Denies SI or HI.  ?Denies AH or VH. ?Denies recent alcohol use. ?Smokes tobacco and THC - has a prostetic jaw and experinecing daily pain from it.  ? ?Previous medication trials:  Welbutrin, Buspar, Abilify, Lunesta, Trazadone, Cymbalta ? ? ?Flowsheet Row ED from 04/08/2022 in [facility]  ?C-SSRS RISK CATEGORY No Risk  ? ?  ?  ? ?Review of Systems:  ?Review of Systems  ?Musculoskeletal:  Negative for gait problem.  ?Neurological:  Negative for tremors.  ?Psychiatric/Behavioral:    ?      Please refer to HPI  ? ?Medications: I have reviewed the patient's current medications. ? ?Current Outpatient Medications  ?Medication Sig Dispense Refill  ? amLODipine (NORVASC) 10 MG tablet Take 1 tablet (10 mg total) by mouth daily. 30 tablet 0  ? ARIPiprazole (ABILIFY) 10 MG tablet Take one tablet daily. 30 tablet 5  ? BUPROPION HBR ER PO Take by mouth.    ? busPIRone (BUSPAR) 5 MG tablet Take 1 tablet (5 mg total) by mouth 3 (three) times daily. 90 tablet 5  ? DULoxetine (CYMBALTA) 60 MG capsule Take 1 capsule (60 mg total) by mouth daily. 30 capsule 5  ? Multiple Vitamin (MULTIVITAMIN) capsule Take 1 capsule by mouth daily.    ? nicotine (NICODERM CQ - DOSED IN MG/24 HOURS) 14 mg/24hr patch Place 1 patch (14 mg total) onto the skin daily. 28 patch 0  ? nicotine polacrilex (COMMIT) 2 MG lozenge Take 1 lozenge (2 mg total) by mouth as needed for smoking cessation. 100 tablet 0  ? omeprazole (PRILOSEC) 40 MG capsule Take 40 mg by mouth every morning.    ? telmisartan-hydrochlorothiazide (MICARDIS HCT) 80-12.5 MG tablet     ? timolol (TIMOPTIC) 0.5 % ophthalmic solution SMARTSIG:In Eye(s)    ? traZODone (DESYREL) 100 MG tablet Take 2-3 tablets at bedtime for sleep. 30 tablet 5  ? ?No current facility-administered medications for this visit.  ? ? ?Medication Side Effects: None ? ?Allergies:  ?Allergies  ?Allergen Reactions  ? Pork Allergy   ?  Other reaction(s): GI Upset (intolerance)  ? Pork-Derived Products   ?  Other  reaction(s): GI Upset  ? Ham   ?  Other reaction(s): GI Upset  ? Morphine Hives and Itching  ?  hives ?hives ?hives ?hives ?  ? ? ?Past Medical History:  ?Diagnosis Date  ? Bipolar depression (HCC)   ? Hypertension   ? ? ?Past Medical History, Surgical history, Social history, and Family history were reviewed and updated as appropriate.  ? ?Please see review of systems for further details on the patient's review from today.  ? ?Objective:  ? ?Physical Exam:  ?There were no vitals taken for this  visit. ? ?Physical Exam ?Constitutional:   ?   General: She is not in acute distress. ?Musculoskeletal:     ?   General: No deformity.  ?Neurological:  ?   Mental Status: She is alert and oriented to person, place, and time.  ?   Coordination: Coordination normal.  ?Psychiatric:     ?   Attention and Perception: Attention and perception normal. She does not perceive auditory or visual hallucinations.     ?   Mood and Affect: Mood normal. Mood is not anxious or depressed. Affect is not labile, blunt, angry or inappropriate.     ?   Speech: Speech normal.     ?   Behavior: Behavior normal.     ?   Thought Content: Thought content normal. Thought content is not paranoid or delusional. Thought content does not include homicidal or suicidal ideation. Thought content does not include homicidal or suicidal plan.     ?   Cognition and Memory: Cognition and memory normal.     ?   Judgment: Judgment normal.  ?   Comments: Insight intact  ? ? ?Lab Review:  ?   ?Component Value Date/Time  ? NA 139 04/08/2022 1737  ? K 3.9 04/08/2022 1737  ? CL 104 04/08/2022 1737  ? CO2 29 04/08/2022 1737  ? GLUCOSE 83 04/08/2022 1737  ? BUN 22 04/08/2022 1737  ? CREATININE 1.12 (H) 04/08/2022 1737  ? CALCIUM 10.2 04/08/2022 1737  ? PROT 7.1 04/08/2022 1737  ? ALBUMIN 4.2 04/08/2022 1737  ? AST 18 04/08/2022 1737  ? ALT 15 04/08/2022 1737  ? ALKPHOS 73 04/08/2022 1737  ? BILITOT 0.3 04/08/2022 1737  ? GFRNONAA 55 (L) 04/08/2022 1737  ? ? ?   ?Component Value Date/Time  ? WBC 9.6 04/08/2022 1737  ? RBC 4.21 04/08/2022 1737  ? HGB 12.8 04/08/2022 1737  ? HCT 38.4 04/08/2022 1737  ? PLT 249 04/08/2022 1737  ? MCV 91.2 04/08/2022 1737  ? MCH 30.4 04/08/2022 1737  ? MCHC 33.3 04/08/2022 1737  ? RDW 13.3 04/08/2022 1737  ? ? ?No results found for: POCLITH, LITHIUM  ? ?No results found for: PHENYTOIN, PHENOBARB, VALPROATE, CBMZ  ? ?.res ?Assessment: Plan:   ? ?Plan: ? ?PDMP reviewed ? ?Abilify 10 mg daily ?Trazodone 100 mg - not taking right now - not  needed ?Buspar 5mg  TID ?Duloxetine 60 mg daily ? ?[facility] to [facility] to help with costs. ? ?RTC 4 weeks ? ?Time spent with patient was 25 minutes. Greater than 50% of face to face time with patient was spent on counseling and coordination of care.   ? ?Patient advised to contact office with any questions, adverse effects, or acute worsening in signs and symptoms. ?  ?Discussed potential metabolic side effects associated with atypical antipsychotics, as well as potential risk for movement side effects. Advised pt to contact office if movement side effects  occur.   ? ?Diagnoses and all orders for this visit: ? ?GAD (generalized anxiety disorder) ?-     busPIRone (BUSPAR) 5 MG tablet; Take 1 tablet (5 mg total) by mouth 3 (three) times daily. ?-     DULoxetine (CYMBALTA) 60 MG capsule; Take 1 capsule (60 mg total) by mouth daily. ? ?Bipolar 2 disorder, major depressive episode (HCC) ?-     ARIPiprazole (ABILIFY) 10 MG tablet; Take one tablet daily. ? ?Insomnia due to other mental disorder ? ?Major depressive disorder, recurrent episode, moderate (HCC) ? ?  ? ?Please see After Visit Summary for patient specific instructions. ? ?Future Appointments  ?Date Time Provider Department Center  ?05/06/2022  3:00 PM Mathis Fareowd, [name], LCSW [facility] None  ?05/20/2022  3:00 PM Mathis Fareowd, [name], LCSW [facility] None  ?05/26/2022  3:40 PM Ferrah Panagopoulos, Thereasa Soloegina Nattalie, NP [facility] None  ?05/28/2022  4:00 PM Mathis Fareowd, [name], LCSW [facility] None  ?06/11/2022  3:00 PM Mathis Fareowd, [name], LCSW [facility] None  ?11/29/2023  2:00 PM Tolia, Sunit, DO [facility] None  ? ? ?No orders of the defined types were placed in this encounter. ? ? ?------------------------------- ?

## 2022-05-06 ENCOUNTER — Ambulatory Visit: Payer: Medicare HMO | Admitting: Psychiatry

## 2022-05-20 ENCOUNTER — Ambulatory Visit: Payer: Medicare HMO | Admitting: Psychiatry

## 2022-05-25 ENCOUNTER — Other Ambulatory Visit: Payer: Self-pay | Admitting: Cardiology

## 2022-05-25 DIAGNOSIS — I10 Essential (primary) hypertension: Secondary | ICD-10-CM

## 2022-05-26 ENCOUNTER — Encounter: Payer: Self-pay | Admitting: Adult Health

## 2022-05-26 ENCOUNTER — Ambulatory Visit (INDEPENDENT_AMBULATORY_CARE_PROVIDER_SITE_OTHER): Payer: Medicare HMO | Admitting: Adult Health

## 2022-05-26 DIAGNOSIS — F5105 Insomnia due to other mental disorder: Secondary | ICD-10-CM

## 2022-05-26 DIAGNOSIS — F411 Generalized anxiety disorder: Secondary | ICD-10-CM | POA: Diagnosis not present

## 2022-05-26 DIAGNOSIS — F331 Major depressive disorder, recurrent, moderate: Secondary | ICD-10-CM

## 2022-05-26 DIAGNOSIS — F3181 Bipolar II disorder: Secondary | ICD-10-CM | POA: Diagnosis not present

## 2022-05-26 DIAGNOSIS — F99 Mental disorder, not otherwise specified: Secondary | ICD-10-CM

## 2022-05-26 NOTE — Progress Notes (Signed)
Desiree Torres [place] [id number] 06/18/1956 66 y.o.  Subjective:   Patient ID:  Desiree Torres [place] is a 66 y.o. (DOB 08/05/1956) female.  Chief Complaint: No chief complaint on file.   HPI Desiree Torres Degregorio presents to the office today for follow-up of GAD, MDD, insomnia and Bipolar disorder.  Describes mood today as ". Pleasant. Tearful at times. Mood symptoms - reports depression - a little - "so much going on, anxiety - "very", and irritability - increased. Stating "I have so much going on". Reports some worry and rumination. Mood is consistent. Stopped medications for a period of time and recently restarted them. 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 Varying interest and motivation. Taking medications as prescribed.  Energy levels so-so. Active, does not have a regular exercise routine.   Enjoys some usual interests and activities. Lives alone. Niece visiting and staying with her while looking for her a place to live. Separated from husband of 2 years. Has 4 grown children. [place] family local. Spending time with family. Appetite decreased. Weight lose 144 to 142 pounds - 65". Sleeps well most nights. Averages 3 to 4 hours - mind racing - plans to start taking Trazadone at 50mg . Focus and concentration stable. Completing tasks. Managing aspects of household. 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 Works part time - 3 hours at an [employer] every day. Denies SI or HI.  Denies AH or VH. Denies recent alcohol use. Smokes tobacco and THC twice a week - has a prostetic jaw and experinecing daily pain from it.   Previous medication trials:  Welbutrin, Buspar, Abilify, Lunesta, Trazadone, Cymbalta   Flowsheet Row ED from 04/08/2022 in [facility]  C-SSRS RISK CATEGORY No Risk        Review of Systems:  Review of Systems  Musculoskeletal:  Negative for gait problem.  Neurological:   Negative for tremors.  Psychiatric/Behavioral:         Please refer to HPI   Medications: I have reviewed the patient's current medications.  Current Outpatient Medications  Medication Sig Dispense Refill   amLODipine (NORVASC) 10 MG tablet TAKE 1 TABLET BY MOUTH EVERY DAY 30 tablet 0   ARIPiprazole (ABILIFY) 10 MG tablet Take one tablet daily. 30 tablet 5   BUPROPION HBR ER PO Take by mouth.     busPIRone (BUSPAR) 5 MG tablet Take 1 tablet (5 mg total) by mouth 3 (three) times daily. 90 tablet 5   DULoxetine (CYMBALTA) 60 MG capsule Take 1 capsule (60 mg total) by mouth daily. 30 capsule 5   Multiple Vitamin (MULTIVITAMIN) capsule Take 1 capsule by mouth daily.     nicotine (NICODERM CQ - DOSED IN MG/24 HOURS) 14 mg/24hr patch Place 1 patch (14 mg total) onto the skin daily. 28 patch 0   nicotine polacrilex (COMMIT) 2 MG lozenge Take 1 lozenge (2 mg total) by mouth as needed for smoking cessation. 100 tablet 0   omeprazole (PRILOSEC) 40 MG capsule Take 40 mg by mouth every morning.     telmisartan-hydrochlorothiazide (MICARDIS HCT) 80-12.5 MG tablet      timolol (TIMOPTIC) 0.5 % ophthalmic solution SMARTSIG:In Eye(s)     traZODone (DESYREL) 100 MG tablet Take 2-3 tablets at bedtime for sleep. 30 tablet 5   No current facility-administered medications for this visit.    Medication Side Effects: None  Allergies:  Allergies  Allergen Reactions   Pork  Allergy     Other reaction(s): GI Upset (intolerance)   Pork-Derived Products     Other reaction(s): GI Upset   Ham     Other reaction(s): GI Upset   Morphine Hives and Itching    hives hives hives hives     Past Medical History:  Diagnosis Date   Bipolar depression (HCC)    Hypertension     Past Medical History, Surgical history, Social history, and Family history were reviewed and updated as appropriate.   Please see review of systems for further details on the patient's review from today.   Objective:   Physical  Exam:  There were no vitals taken for this visit.  Physical Exam Constitutional:      General: She is not in acute distress. Musculoskeletal:        General: No deformity.  Neurological:     Mental Status: She is alert and oriented to person, place, and time.     Coordination: Coordination normal.  Psychiatric:        Attention and Perception: Attention and perception normal. She does not perceive auditory or visual hallucinations.        Mood and Affect: Mood normal. Mood is not anxious or depressed. Affect is not labile, blunt, angry or inappropriate.        Speech: Speech normal.        Behavior: Behavior normal.        Thought Content: Thought content normal. Thought content is not paranoid or delusional. Thought content does not include homicidal or suicidal ideation. Thought content does not include homicidal or suicidal plan.        Cognition and Memory: Cognition and memory normal.        Judgment: Judgment normal.     Comments: Insight intact    Lab Review:     Component Value Date/Time   NA 139 04/08/2022 1737   K 3.9 04/08/2022 1737   CL 104 04/08/2022 1737   CO2 29 04/08/2022 1737   GLUCOSE 83 04/08/2022 1737   BUN 22 04/08/2022 1737   CREATININE 1.12 (H) 04/08/2022 1737   CALCIUM 10.2 04/08/2022 1737   PROT 7.1 04/08/2022 1737   ALBUMIN 4.2 04/08/2022 1737   AST 18 04/08/2022 1737   ALT 15 04/08/2022 1737   ALKPHOS 73 04/08/2022 1737   BILITOT 0.3 04/08/2022 1737   GFRNONAA 55 (L) 04/08/2022 1737       Component Value Date/Time   WBC 9.6 04/08/2022 1737   RBC 4.21 04/08/2022 1737   HGB 12.8 04/08/2022 1737   HCT 38.4 04/08/2022 1737   PLT 249 04/08/2022 1737   MCV 91.2 04/08/2022 1737   MCH 30.4 04/08/2022 1737   MCHC 33.3 04/08/2022 1737   RDW 13.3 04/08/2022 1737    No results found for: POCLITH, LITHIUM   No results found for: PHENYTOIN, PHENOBARB, VALPROATE, CBMZ   .res Assessment: Plan:    Plan:  PDMP reviewed  Abilify 10 mg  daily Trazodone 100 mg - not taking right now - not needed Buspar 5mg  TID Duloxetine 60 mg daily  RTC 4 weeks  Time spent with patient was 25 minutes. Greater than 50% of face to face time with patient was spent on counseling and coordination of care.    Patient advised to contact office with any questions, adverse effects, or acute worsening in signs and symptoms.   Discussed potential metabolic side effects associated with atypical antipsychotics, as well as potential risk for movement side effects.  Advised pt to contact office if movement side effects occur.    Diagnoses and all orders for this visit:  GAD (generalized anxiety disorder)  Bipolar 2 disorder, major depressive episode (HCC)  Insomnia due to other mental disorder  Major depressive disorder, recurrent episode, moderate (HCC)     Please see After Visit Summary for patient specific instructions.  Future Appointments  Date Time Provider Department Center  05/28/2022  4:00 PM Mathis Fare, [place] [facility] None  06/11/2022  3:00 PM Mathis Fare, LCSW [facility] None  11/29/2023  2:00 PM Tolia, Sunit, DO [facility] None    No orders of the defined types were placed in this encounter.   -------------------------------

## 2022-05-28 ENCOUNTER — Ambulatory Visit (INDEPENDENT_AMBULATORY_CARE_PROVIDER_SITE_OTHER): Payer: Medicare HMO | Admitting: Psychiatry

## 2022-05-28 DIAGNOSIS — F411 Generalized anxiety disorder: Secondary | ICD-10-CM

## 2022-05-28 NOTE — Progress Notes (Signed)
    [facility] Counselor/Therapist Progress Note  Patient ID: Desiree Torres, MRN: [id number],    Date: 05/28/2022  Time Spent: 58 minutes   Treatment Type: Individual Therapy  Reported Symptoms:  anxiety, irritable, some depression  Mental Status Exam:  Appearance:   Neat     Behavior:  Appropriate, Sharing, and Motivated  Motor:  Normal  Speech/Language:   Clear and Coherent  Affect:  Anxious, some depression, irritability  Mood:  anxious, depressed, and irritable  Thought process:  goal directed  Thought content:    overthinking  Sensory/Perceptual disturbances:    WNL  Orientation:  oriented to person, place, time/date, situation, day of week, month of year, year, and stated date of May 28, 2022  Attention:  Good  Concentration:  Fair  Memory:  WNL  Fund of knowledge:   Good  Insight:    Good  Judgment:   Good  Impulse Control:  Fair   Risk Assessment: Danger to Self:  No Self-injurious Behavior: No Danger to Others: No Duty to Warn:no Physical Aggression / Violence:No  Access to Firearms a concern: No  Gang Involvement:No   Subjective: Patient in today reporting anxiety, some depression, and irritability mostly related to personal, family, finances. "A lot going on in my life", issues heightened with separated husband "but no issues of harm involved" and he is in [place]. Did see her Dr back in [place] about colon issues she's been having and has been on it 4 days. Has since seen a local doctor and got meds. States she is calling that dr as she forgot to ask if she was to keep taking the meds from dr in [place]. Met a friend recently, and spends some time there (in [place] Worked today on past issues with husband that keeps her angry, frustrated, and anxious and sometimes depressed. Wanting to let go of husband, with forgiveness "not to say what all he did was ok but to say that I'm not hanging onto him so that I can move forward and live my life." Feeling calmer and less alone by  session end as she discussed how she's feeling more ready to set limit with former husband to hopefully get his cooperation and they are agreeing not to contact each other anymore.  Not isolating intentionally anymore but has had some health issues and has been on having more time alone and is following up with her doctor again today/tomorrow with a couple of questions she has about her medicine.  Reports not being as "hard on myself" recently.  Also, picking up from last session, continues to work on not being impulsive and feels that she is making progress.  Interventions: Solution-Oriented/Positive Psychology and Insight-Oriented  Treatment goals: Treatment goals remain on treatment plan as patient works with strategies to achieve her goals. Progress is assessed each session and documented" subject" and/or "plan" sections of treatment note. Long-term goal: Elevate mood and show evidence of usual energy, activities, and socialization. Short-term goal: Verbalize and understanding of the relationship between repressed anger and depressed mood. Strategies: Verbalize hopeful and positive statements regarding the future.   Diagnosis:   ICD-10-CM   1. GAD (generalized anxiety disorder)  F41.1      Plan:  Patient today showing good participation and motivation in session as we worked on her depression, irritability, patient sharing some financial concerns, and especially some issues and trying to set limits with her previous husband.  As noted above she was able to reach some conclusions on how   she wanted to handle setting some limits with him and asking that they not be in contact anymore.  She does not feel that he would just show up unannounced or try to harm her but there is emotional ties and his texting is not helping.  Felt better towards the end of session as to how she can ask that he not be contacting her anymore without getting into a lot of specifics and in a calm manner.  She feels that  would be her best strategy at this point.  Some reoccurring health issues that she did see her doctor for and is to pick up the medications today.  Discussed more about forgiveness issues of former husband and she did some good work on that in session as well today with some increased insight about the benefits for her and a better understanding that "forgiveness" is not to say that his actions were okay but rather forgiveness in the sense of her being able to move forward. Encouraged patient in her practice of more positive behaviors including: Getting outside daily and walking, staying in touch with people who are supportive, looking for more positives versus negatives daily, remain on her prescribed medication, staying in the present focusing on what she can control or change, allowing for healthier sleep patterns, healthy nutrition and exercise, stop assuming negatives and worst case scenarios, positive self talk, consider using daily affirmations, stop self negating, limit her time on electronics daily, reduce overthinking and over analyzing, challenge and counteract her self doubt, allow her faith to be an emotional support as well as spiritual, interrupt negative/anxious thoughts to challenge them and replace with more reality-based thoughts, practice saying no when she needs to say no, letting go of things including guilt from the past that holds her back as discussed in sessions, continue work on her control issues, and recognize the strength she shows when working with goal directed behaviors to move in a direction that supports her improved emotional health and overall outlook.  Goal review and progress/challenges noted with patient.  Next appointment within 2 to 3 weeks.  This record has been created using [employer].  Chart creation errors have been sought, but may not always have been located and corrected.  Such creation errors do not reflect on the standard of medical care  provided.   [name], LCSW

## 2022-06-11 ENCOUNTER — Ambulatory Visit (INDEPENDENT_AMBULATORY_CARE_PROVIDER_SITE_OTHER): Payer: Medicare HMO | Admitting: Psychiatry

## 2022-06-11 DIAGNOSIS — F411 Generalized anxiety disorder: Secondary | ICD-10-CM | POA: Diagnosis not present

## 2022-06-11 NOTE — Progress Notes (Signed)
[facility] Counselor/Therapist Progress Note  Patient ID: Desiree Torres, MRN: [id number],    Date: 06/11/2022  Time Spent: 57 minutes    Treatment Type: Individual Therapy  Reported Symptoms: depressed, tired, some anxiety  Mental Status Exam:  Appearance:   Casual     Behavior:  Appropriate, Sharing, and Motivated  Motor:  Normal  Speech/Language:   Clear and Coherent  Affect:  Depressed and anxious  Mood:  anxious and depressed  Thought process:  goal directed  Thought content:    Rumination  Sensory/Perceptual disturbances:    WNL  Orientation:  oriented to person, place, time/date, situation, day of week, month of year, year, and stated date of June 22,   Attention:  Good  Concentration:  Good and Fair  Memory:  WNL  Fund of knowledge:   Good  Insight:    Good and Fair  Judgment:   Good  Impulse Control:  Fair   Risk Assessment: Danger to Self:  No Self-injurious Behavior: No Danger to Others: No Duty to Warn:no Physical Aggression / Violence:No  Access to Firearms a concern: No  Gang Involvement:No   Subjective: Patient in today reporting anxiety, depression , some irritability related to personal, financial, and family issues. Situation with separated husband is calming down as they get closer to their "divorce proceedings".  Disappointed in how former husband is handling things with her "but am trying not to overly focus on that", although did process her thoughts and feelings in reference to her former husband and their pending divorce. Feels that "I am letting go of him and want him to leave me alone" . Working on forgiveness of husband "but he keeps on doing stupid things" but "nothing related to threats or anything like that."Spoke about her finding a church that is feeling comfortable to her and trying to meet more people.  Shares that she saw her doctor and is on meds for her colon issue and meds seem to help. Calmer and feeling more heard by session end.  Encourage to stay connected with the "1 friend" she has locally while being open to meeting others as she has opportunities. Agrees to work on not isolating self. Continues to work on decreasing impulsivity with some success.  Interventions: Cognitive Behavioral Therapy, Solution-Oriented/Positive Psychology, and Ego-Supportive  Treatment goals: Treatment goals remain on treatment plan as patient works with strategies to achieve her goals. Progress is assessed each session and documented" subject" and/or "plan" sections of treatment note. Long-term goal: Elevate mood and show evidence of usual energy, activities, and socialization. Short-term goal: Verbalize and understanding of the relationship between repressed anger and depressed mood. Strategies: Verbalize hopeful and positive statements regarding the future.  Diagnosis:   ICD-10-CM   1. GAD (generalized anxiety disorder)  F41.1      Plan: Patient today showing good motivation and participation in session as she focused more on her anxiety, depression, irritability, loneliness, and setting limits with her previous husband as well as others.  Stressors include her pending divorce, financial concerns, and family issues.  Is awaiting her pending divorce which she states should be finalized within the next couple of weeks.  Is showing progress in her strength, judgment when meeting new people, trying to make good decisions, working on not isolating herself as much, and remaining on her prescribed medications.  Working on forgiveness issues that are related to former husband and states, it will be easier once we are divorced".  Did review an understanding of  forgiveness not to say that his actions were okay but rather forgiveness really more for the patient and her being able to move forward.  Wanting to be able to move forward in trying to take some steps now that can help her.  Mood did seem better at end of session after she had vented a lot of  her thoughts and feelings with what are she is dealing with right now.  Encouraged patient in her practice of more positive behaviors including: Getting outside daily, staying in touch with people who are supportive, looking for more positive versus negatives daily, remain on her prescribed medication, staying in the present focusing on what she can change, allowing for healthier sleep patterns, healthy nutrition and exercise, stop assuming negatives and worst case scenarios, positive self talk, consider using daily affirmations, stop self negating, limit time on her electronics daily, reduce overthinking and over analyzing, challenge and counteract her self doubt, allow her faith to be an emotional support as well as spiritual, interrupt negative/anxious thoughts to challenge them and replace with more reality-based thoughts, practice saying no when she needs to say no, letting go of things including guilt from the past that hold her back as discussed in sessions, continue work on her control issues, and realize the strength she shows when working with goal directed behaviors to move in a direction that supports her improved emotional health.  Goal review and progress/challenges noted with patient.  Next appointment within 3 weeks.  This record has been created using [employer].  Chart creation errors have been sought, but may not always have been located and corrected.  Such creation errors do not reflect on the standard of medical care provided.   Mathis Fare, LCSW

## 2022-06-22 ENCOUNTER — Telehealth: Payer: Self-pay | Admitting: Adult Health

## 2022-06-22 NOTE — Telephone Encounter (Signed)
Desiree Torres called this morning thinking that she needs to be inpatient somewhere.  She has only been taking her medication on and off and doesn't like the way she feels.  She needs to get back on tract and thinks she needs to be somewhere that she will take her medications until she is stable again.  She has appt. 7/18 but would like to get started on a regular routine of taking her medications and needs help to do that. Please call to discuss

## 2022-06-22 NOTE — Telephone Encounter (Signed)
Spoke to pt ,she wants me to call her back later today

## 2022-06-22 NOTE — Telephone Encounter (Signed)
Pt stated she is having a hard time taking meds.It's not that she forgets to take it,she feels like she needs more discipline.She does not take them as she should because she does not like the way they make her feel,but she knows she needs them.She wants to know if there is somewhere she can go that will monitor her meds to make sure she takes them.

## 2022-06-24 NOTE — Telephone Encounter (Signed)
We can discuss at her next appt. Or she can go to [facility] if she needs assistance before appt.

## 2022-07-02 ENCOUNTER — Ambulatory Visit (INDEPENDENT_AMBULATORY_CARE_PROVIDER_SITE_OTHER): Payer: Medicare HMO | Admitting: Psychiatry

## 2022-07-02 ENCOUNTER — Ambulatory Visit (HOSPITAL_COMMUNITY)
Admission: EM | Admit: 2022-07-02 | Discharge: 2022-07-03 | Disposition: A | Discharge status: Other Acute Inpatient Hospital | Payer: Medicare HMO | Attending: Family | Admitting: Family

## 2022-07-02 ENCOUNTER — Ambulatory Visit: Payer: Medicare HMO | Admitting: Psychiatry

## 2022-07-02 ENCOUNTER — Other Ambulatory Visit: Payer: Self-pay

## 2022-07-02 DIAGNOSIS — R45851 Suicidal ideations: Secondary | ICD-10-CM | POA: Insufficient documentation

## 2022-07-02 DIAGNOSIS — Z79899 Other long term (current) drug therapy: Secondary | ICD-10-CM | POA: Diagnosis not present

## 2022-07-02 DIAGNOSIS — Z9889 Other specified postprocedural states: Secondary | ICD-10-CM | POA: Insufficient documentation

## 2022-07-02 DIAGNOSIS — F17210 Nicotine dependence, cigarettes, uncomplicated: Secondary | ICD-10-CM | POA: Diagnosis not present

## 2022-07-02 DIAGNOSIS — G4700 Insomnia, unspecified: Secondary | ICD-10-CM | POA: Diagnosis not present

## 2022-07-02 DIAGNOSIS — Z20822 Contact with and (suspected) exposure to covid-19: Secondary | ICD-10-CM | POA: Diagnosis not present

## 2022-07-02 DIAGNOSIS — R4584 Anhedonia: Secondary | ICD-10-CM | POA: Insufficient documentation

## 2022-07-02 DIAGNOSIS — F3181 Bipolar II disorder: Secondary | ICD-10-CM | POA: Diagnosis not present

## 2022-07-02 DIAGNOSIS — G8929 Other chronic pain: Secondary | ICD-10-CM | POA: Insufficient documentation

## 2022-07-02 DIAGNOSIS — F332 Major depressive disorder, recurrent severe without psychotic features: Secondary | ICD-10-CM | POA: Diagnosis present

## 2022-07-02 LAB — POCT URINE DRUG SCREEN - MANUAL ENTRY (I-SCREEN)
POC Amphetamine UR: NOT DETECTED
POC Buprenorphine (BUP): NOT DETECTED
POC Cocaine UR: NOT DETECTED
POC Marijuana UR: POSITIVE — AB
POC Methadone UR: NOT DETECTED
POC Methamphetamine UR: NOT DETECTED
POC Morphine: NOT DETECTED
POC Oxazepam (BZO): NOT DETECTED
POC Oxycodone UR: NOT DETECTED
POC Secobarbital (BAR): NOT DETECTED

## 2022-07-02 LAB — RESP PANEL BY RT-PCR (FLU A&B, COVID) ARPGX2
Influenza A by PCR: NEGATIVE
Influenza B by PCR: NEGATIVE
SARS Coronavirus 2 by RT PCR: NEGATIVE

## 2022-07-02 LAB — POC SARS CORONAVIRUS 2 AG: SARSCOV2ONAVIRUS 2 AG: NEGATIVE

## 2022-07-02 LAB — POCT PREGNANCY, URINE: Preg Test, Ur: NEGATIVE

## 2022-07-02 MED ORDER — PROSIGHT PO TABS
1.0000 | ORAL_TABLET | Freq: Every day | ORAL | Status: DC
  Administered 2022-07-03: 1 via ORAL
  Filled 2022-07-02 (×2): qty 1

## 2022-07-02 MED ORDER — ARIPIPRAZOLE 10 MG PO TABS
10.0000 mg | ORAL_TABLET | Freq: Every day | ORAL | Status: DC
  Administered 2022-07-02: 10 mg via ORAL
  Filled 2022-07-02: qty 1

## 2022-07-02 MED ORDER — DULOXETINE HCL 60 MG PO CPEP
60.0000 mg | ORAL_CAPSULE | Freq: Every day | ORAL | Status: DC
  Filled 2022-07-02: qty 1

## 2022-07-02 MED ORDER — TRAZODONE HCL 50 MG PO TABS
50.0000 mg | ORAL_TABLET | Freq: Every day | ORAL | Status: DC
  Administered 2022-07-02: 50 mg via ORAL
  Filled 2022-07-02: qty 1

## 2022-07-02 MED ORDER — NICOTINE 14 MG/24HR TD PT24
14.0000 mg | MEDICATED_PATCH | Freq: Every day | TRANSDERMAL | Status: DC
Start: 2022-07-02 — End: 2022-07-03
  Administered 2022-07-02 – 2022-07-03 (×2): 14 mg via TRANSDERMAL
  Filled 2022-07-02 (×2): qty 1

## 2022-07-02 MED ORDER — TELMISARTAN-HCTZ 80-12.5 MG PO TABS: 1.0000 | ORAL_TABLET | Freq: Every day | ORAL | Status: DC

## 2022-07-02 MED ORDER — IRBESARTAN 75 MG PO TABS
300.0000 mg | ORAL_TABLET | Freq: Every day | ORAL | Status: DC
  Administered 2022-07-03: 300 mg via ORAL
  Filled 2022-07-02: qty 4

## 2022-07-02 MED ORDER — HYDROCHLOROTHIAZIDE 12.5 MG PO TABS
12.5000 mg | ORAL_TABLET | Freq: Every day | ORAL | Status: DC
  Administered 2022-07-03: 12.5 mg via ORAL
  Filled 2022-07-02: qty 1

## 2022-07-02 MED ORDER — ALUM & MAG HYDROXIDE-SIMETH 200-200-20 MG/5ML PO SUSP: 30.0000 mL | ORAL | Status: DC | PRN

## 2022-07-02 MED ORDER — ACETAMINOPHEN 325 MG PO TABS: 650.0000 mg | ORAL_TABLET | Freq: Four times a day (QID) | ORAL | Status: DC | PRN

## 2022-07-02 MED ORDER — BUSPIRONE HCL 5 MG PO TABS
5.0000 mg | ORAL_TABLET | Freq: Three times a day (TID) | ORAL | Status: DC
  Administered 2022-07-02 – 2022-07-03 (×2): 5 mg via ORAL
  Filled 2022-07-02 (×2): qty 1

## 2022-07-02 MED ORDER — PANTOPRAZOLE SODIUM 40 MG PO TBEC: 40.0000 mg | DELAYED_RELEASE_TABLET | Freq: Every day | ORAL | Status: DC

## 2022-07-02 MED ORDER — MAGNESIUM HYDROXIDE 400 MG/5ML PO SUSP: 30.0000 mL | Freq: Every day | ORAL | Status: DC | PRN

## 2022-07-02 MED ORDER — AMLODIPINE BESYLATE 10 MG PO TABS
10.0000 mg | ORAL_TABLET | Freq: Every day | ORAL | Status: DC
  Administered 2022-07-03: 10 mg via ORAL
  Filled 2022-07-02: qty 1

## 2022-07-02 NOTE — ED Notes (Signed)
Patient compliant with all admission procedures, oriented to unit ,denies current SI/HI/AVH, pleasant, food given to patient.   Marjena RN attempted to draw blood x2 with no success.
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
## 2022-07-02 NOTE — ED Notes (Signed)
Patient arrived on unit given meal. Patient cooperative and calm. Patient safe on unit with continued monitoring.
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
## 2022-07-03 ENCOUNTER — Inpatient Hospital Stay (HOSPITAL_COMMUNITY)
Admission: AD | Admit: 2022-07-03 | Discharge: 2022-07-07 | DRG: 885 | Disposition: A | Discharge status: Home / Self Care | Payer: Medicare HMO | Source: Intra-hospital | Attending: Psychiatry | Admitting: Psychiatry

## 2022-07-03 ENCOUNTER — Encounter (HOSPITAL_COMMUNITY): Payer: Self-pay | Admitting: Family

## 2022-07-03 DIAGNOSIS — E7800 Pure hypercholesterolemia, unspecified: Secondary | ICD-10-CM | POA: Diagnosis present

## 2022-07-03 DIAGNOSIS — F4310 Post-traumatic stress disorder, unspecified: Secondary | ICD-10-CM | POA: Diagnosis present

## 2022-07-03 DIAGNOSIS — I10 Essential (primary) hypertension: Secondary | ICD-10-CM | POA: Diagnosis present

## 2022-07-03 DIAGNOSIS — Z79899 Other long term (current) drug therapy: Secondary | ICD-10-CM | POA: Diagnosis not present

## 2022-07-03 DIAGNOSIS — Z20822 Contact with and (suspected) exposure to covid-19: Secondary | ICD-10-CM | POA: Diagnosis present

## 2022-07-03 DIAGNOSIS — F411 Generalized anxiety disorder: Secondary | ICD-10-CM | POA: Diagnosis present

## 2022-07-03 DIAGNOSIS — Z91148 Patient's other noncompliance with medication regimen for other reason: Secondary | ICD-10-CM | POA: Diagnosis not present

## 2022-07-03 DIAGNOSIS — F515 Nightmare disorder: Secondary | ICD-10-CM | POA: Diagnosis present

## 2022-07-03 DIAGNOSIS — Z59819 Housing instability, housed unspecified: Secondary | ICD-10-CM | POA: Diagnosis not present

## 2022-07-03 DIAGNOSIS — Z91014 Allergy to mammalian meats: Secondary | ICD-10-CM

## 2022-07-03 DIAGNOSIS — K219 Gastro-esophageal reflux disease without esophagitis: Secondary | ICD-10-CM | POA: Diagnosis present

## 2022-07-03 DIAGNOSIS — G4700 Insomnia, unspecified: Secondary | ICD-10-CM | POA: Diagnosis present

## 2022-07-03 DIAGNOSIS — F3160 Bipolar disorder, current episode mixed, unspecified: Principal | ICD-10-CM | POA: Diagnosis present

## 2022-07-03 DIAGNOSIS — F17210 Nicotine dependence, cigarettes, uncomplicated: Secondary | ICD-10-CM | POA: Diagnosis present

## 2022-07-03 DIAGNOSIS — Z885 Allergy status to narcotic agent status: Secondary | ICD-10-CM | POA: Diagnosis not present

## 2022-07-03 DIAGNOSIS — F3181 Bipolar II disorder: Secondary | ICD-10-CM | POA: Diagnosis present

## 2022-07-03 DIAGNOSIS — R441 Visual hallucinations: Secondary | ICD-10-CM | POA: Diagnosis present

## 2022-07-03 DIAGNOSIS — Z635 Disruption of family by separation and divorce: Secondary | ICD-10-CM | POA: Diagnosis not present

## 2022-07-03 DIAGNOSIS — Z8249 Family history of ischemic heart disease and other diseases of the circulatory system: Secondary | ICD-10-CM

## 2022-07-03 DIAGNOSIS — R6884 Jaw pain: Secondary | ICD-10-CM | POA: Diagnosis present

## 2022-07-03 DIAGNOSIS — K589 Irritable bowel syndrome without diarrhea: Secondary | ICD-10-CM | POA: Diagnosis present

## 2022-07-03 DIAGNOSIS — F4010 Social phobia, unspecified: Secondary | ICD-10-CM | POA: Diagnosis present

## 2022-07-03 DIAGNOSIS — F1990 Other psychoactive substance use, unspecified, uncomplicated: Secondary | ICD-10-CM | POA: Diagnosis present

## 2022-07-03 DIAGNOSIS — Z818 Family history of other mental and behavioral disorders: Secondary | ICD-10-CM | POA: Diagnosis not present

## 2022-07-03 LAB — COMPREHENSIVE METABOLIC PANEL
ALT: 15 U/L (ref 0–44)
AST: 22 U/L (ref 15–41)
Albumin: 3.8 g/dL (ref 3.5–5.0)
Alkaline Phosphatase: 69 U/L (ref 38–126)
Anion gap: 11 (ref 5–15)
BUN: 12 mg/dL (ref 8–23)
CO2: 27 mmol/L (ref 22–32)
Calcium: 10 mg/dL (ref 8.9–10.3)
Chloride: 105 mmol/L (ref 98–111)
Creatinine, Ser: 0.92 mg/dL (ref 0.44–1.00)
GFR, Estimated: >60 mL/min (ref >60)
Glucose, Bld: 133 mg/dL — ABNORMAL HIGH (ref 70–99)
Potassium: 4.5 mmol/L (ref 3.5–5.1)
Sodium: 143 mmol/L (ref 135–145)
Total Bilirubin: 0.8 mg/dL (ref 0.3–1.2)
Total Protein: 6.4 g/dL — ABNORMAL LOW (ref 6.5–8.1)

## 2022-07-03 LAB — CBC WITH DIFFERENTIAL/PLATELET
Abs Immature Granulocytes: 0.01 10*3/uL (ref 0.00–0.07)
Basophils Absolute: 0 10*3/uL (ref 0.0–0.1)
Basophils Relative: 1 %
Eosinophils Absolute: 0.1 10*3/uL (ref 0.0–0.5)
Eosinophils Relative: 2 %
HCT: 41.1 % (ref 36.0–46.0)
Hemoglobin: 13.8 g/dL (ref 12.0–15.0)
Immature Granulocytes: 0 %
Lymphocytes Relative: 45 %
Lymphs Abs: 2.6 10*3/uL (ref 0.7–4.0)
MCH: 30.9 pg (ref 26.0–34.0)
MCHC: 33.6 g/dL (ref 30.0–36.0)
MCV: 91.9 fL (ref 80.0–100.0)
Monocytes Absolute: 0.4 10*3/uL (ref 0.1–1.0)
Monocytes Relative: 8 %
Neutro Abs: 2.5 10*3/uL (ref 1.7–7.7)
Neutrophils Relative %: 44 %
Platelets: 288 10*3/uL (ref 150–400)
RBC: 4.47 MIL/uL (ref 3.87–5.11)
RDW: 13.1 % (ref 11.5–15.5)
WBC: 5.8 10*3/uL (ref 4.0–10.5)
nRBC: 0 % (ref 0.0–0.2)

## 2022-07-03 LAB — LIPID PANEL
Cholesterol: 187 mg/dL (ref 0–200)
HDL: 62 mg/dL (ref >40)
LDL Cholesterol: 107 mg/dL — ABNORMAL HIGH (ref 0–99)
Total CHOL/HDL Ratio: 3 RATIO
Triglycerides: 89 mg/dL (ref <150)
VLDL: 18 mg/dL (ref 0–40)

## 2022-07-03 LAB — HEMOGLOBIN A1C
Hgb A1c MFr Bld: 4.9 % (ref 4.8–5.6)
Mean Plasma Glucose: 93.93 mg/dL

## 2022-07-03 LAB — ETHANOL: Alcohol, Ethyl (B): <10 mg/dL (ref <10)

## 2022-07-03 LAB — MAGNESIUM: Magnesium: 2.3 mg/dL (ref 1.7–2.4)

## 2022-07-03 LAB — TSH: TSH: 0.838 u[IU]/mL (ref 0.350–4.500)

## 2022-07-03 MED ORDER — DULOXETINE HCL 60 MG PO CPEP
60.0000 mg | ORAL_CAPSULE | Freq: Every day | ORAL | Status: DC
Start: 2022-07-03 — End: 2022-07-05
  Administered 2022-07-03 – 2022-07-04 (×2): 60 mg via ORAL
  Filled 2022-07-03 (×3): qty 1

## 2022-07-03 MED ORDER — ALUM & MAG HYDROXIDE-SIMETH 200-200-20 MG/5ML PO SUSP: 30.0000 mL | ORAL | Status: DC | PRN

## 2022-07-03 MED ORDER — BUSPIRONE HCL 5 MG PO TABS
5.0000 mg | ORAL_TABLET | Freq: Three times a day (TID) | ORAL | Status: DC
  Administered 2022-07-03 – 2022-07-04 (×2): 5 mg via ORAL
  Filled 2022-07-03 (×6): qty 1

## 2022-07-03 MED ORDER — NON FORMULARY: 40.0000 mg | Freq: Every morning | Status: DC

## 2022-07-03 MED ORDER — DORZOLAMIDE HCL-TIMOLOL MAL 2-0.5 % OP SOLN
1.0000 [drp] | Freq: Two times a day (BID) | OPHTHALMIC | Status: DC
  Administered 2022-07-03: 1 [drp] via OPHTHALMIC
  Filled 2022-07-03: qty 10

## 2022-07-03 MED ORDER — DULOXETINE HCL 60 MG PO CPEP
60.0000 mg | ORAL_CAPSULE | Freq: Every day | ORAL | Status: DC
  Filled 2022-07-03 (×3): qty 1

## 2022-07-03 MED ORDER — TRAZODONE HCL 50 MG PO TABS
50.0000 mg | ORAL_TABLET | Freq: Every day | ORAL | Status: DC
  Administered 2022-07-03 – 2022-07-05 (×3): 50 mg via ORAL
  Filled 2022-07-03 (×8): qty 1

## 2022-07-03 MED ORDER — ACETAMINOPHEN 325 MG PO TABS
650.0000 mg | ORAL_TABLET | Freq: Four times a day (QID) | ORAL | Status: DC | PRN
  Administered 2022-07-03 – 2022-07-04 (×2): 650 mg via ORAL
  Filled 2022-07-03 (×2): qty 2

## 2022-07-03 MED ORDER — ARIPIPRAZOLE 10 MG PO TABS
10.0000 mg | ORAL_TABLET | Freq: Every day | ORAL | Status: DC
  Administered 2022-07-03: 10 mg via ORAL
  Filled 2022-07-03 (×2): qty 1

## 2022-07-03 MED ORDER — ARIPIPRAZOLE 10 MG PO TABS
10.0000 mg | ORAL_TABLET | Freq: Every day | ORAL | Status: DC
  Filled 2022-07-03 (×3): qty 1

## 2022-07-03 MED ORDER — HYDROCHLOROTHIAZIDE 12.5 MG PO TABS
12.5000 mg | ORAL_TABLET | Freq: Every day | ORAL | Status: DC
Start: 2022-07-03 — End: 2022-07-07
  Administered 2022-07-04 – 2022-07-07 (×4): 12.5 mg via ORAL
  Filled 2022-07-03 (×7): qty 1

## 2022-07-03 MED ORDER — OMEPRAZOLE 20 MG PO CPDR
40.0000 mg | DELAYED_RELEASE_CAPSULE | Freq: Every day | ORAL | Status: DC
  Administered 2022-07-04 – 2022-07-07 (×4): 40 mg via ORAL
  Filled 2022-07-03 (×6): qty 2

## 2022-07-03 MED ORDER — ERYTHROMYCIN BASE 250 MG PO TBEC
250.0000 mg | DELAYED_RELEASE_TABLET | Freq: Three times a day (TID) | ORAL | Status: DC
  Administered 2022-07-03 – 2022-07-07 (×12): 250 mg via ORAL
  Filled 2022-07-03 (×17): qty 1

## 2022-07-03 MED ORDER — IRBESARTAN 300 MG PO TABS
300.0000 mg | ORAL_TABLET | Freq: Every day | ORAL | Status: DC
  Administered 2022-07-04 – 2022-07-07 (×4): 300 mg via ORAL
  Filled 2022-07-03 (×7): qty 1

## 2022-07-03 MED ORDER — ADULT MULTIVITAMIN W/MINERALS CH
1.0000 | ORAL_TABLET | Freq: Every day | ORAL | Status: DC
  Administered 2022-07-04 – 2022-07-07 (×4): 1 via ORAL
  Filled 2022-07-03 (×7): qty 1

## 2022-07-03 MED ORDER — MAGNESIUM HYDROXIDE 400 MG/5ML PO SUSP: 30.0000 mL | Freq: Every day | ORAL | Status: DC | PRN

## 2022-07-03 MED ORDER — TELMISARTAN-HCTZ 80-12.5 MG PO TABS
1.0000 | ORAL_TABLET | Freq: Every day | ORAL | Status: DC
Start: 2022-07-04 — End: 2022-07-03

## 2022-07-03 MED ORDER — NON FORMULARY: 40.0000 mg | Freq: Every day | Status: DC

## 2022-07-03 MED ORDER — OMEPRAZOLE 20 MG PO CPDR
40.0000 mg | DELAYED_RELEASE_CAPSULE | Freq: Every day | ORAL | Status: DC
  Filled 2022-07-03 (×2): qty 2

## 2022-07-03 MED ORDER — DORZOLAMIDE HCL-TIMOLOL MAL 2-0.5 % OP SOLN
1.0000 [drp] | Freq: Two times a day (BID) | OPHTHALMIC | Status: DC
Start: 2022-07-03 — End: 2022-07-07
  Administered 2022-07-03 – 2022-07-07 (×8): 1 [drp] via OPHTHALMIC
  Filled 2022-07-03: qty 10

## 2022-07-03 MED ORDER — ERYTHROMYCIN BASE 250 MG PO TABS
250.0000 mg | ORAL_TABLET | Freq: Three times a day (TID) | ORAL | Status: DC
Start: 2022-07-03 — End: 2022-07-03
  Filled 2022-07-03 (×3): qty 1

## 2022-07-03 MED ORDER — NICOTINE 14 MG/24HR TD PT24
14.0000 mg | MEDICATED_PATCH | Freq: Every day | TRANSDERMAL | Status: DC
Start: 2022-07-04 — End: 2022-07-07
  Administered 2022-07-04 – 2022-07-07 (×4): 14 mg via TRANSDERMAL
  Filled 2022-07-03 (×8): qty 1

## 2022-07-03 NOTE — ED Notes (Signed)
Report given to Abby, RN at [facility].

## 2022-07-03 NOTE — ED Notes (Signed)
Patient to be transferred to [facility].

## 2022-07-03 NOTE — Progress Notes (Signed)
Brief note: Pharmacy Re: Cream for jaw pain   Patient state she uses a cream for her jaw pain that starts with an "F". She spells fluconazole which is not in a cream form and is an antifungal.  No records in Dr Tiajuana Amass for "F" cream and [facility] and [facility] were called with no Rx from them either.  The only topical found in her pharmacy records is Diclofenac.   Thanks! Lorenza Evangelist 07/03/2022 11:24 AM

## 2022-07-03 NOTE — Progress Notes (Signed)
Blood specimens collected from patient.

## 2022-07-03 NOTE — Plan of Care (Signed)
  Problem: Education: Goal: Knowledge of [place] [facility] Education information/materials will improve Outcome: Progressing Goal: Emotional status will improve Outcome: Progressing Goal: Mental status will improve Outcome: Progressing Goal: Verbalization of understanding the information provided will improve Outcome: Progressing   Problem: Activity: Goal: Interest or engagement in activities will improve Outcome: Progressing Goal: Sleeping patterns will improve Outcome: Progressing   Problem: Coping: Goal: Ability to verbalize frustrations and anger appropriately will improve Outcome: Progressing Goal: Ability to demonstrate self-control will improve Outcome: Progressing   Problem: Health Behavior/Discharge Planning: Goal: Identification of resources available to assist in meeting health care needs will improve Outcome: Progressing Goal: Compliance with treatment plan for underlying cause of condition will improve Outcome: Progressing   Problem: Physical Regulation: Goal: Ability to maintain clinical measurements within normal limits will improve Outcome: Progressing   Problem: Safety: Goal: Periods of time without injury will increase Outcome: Progressing   

## 2022-07-03 NOTE — Tx Team (Signed)
Initial Treatment Plan 07/03/2022 3:01 PM [name] WTU:[id number]    PATIENT STRESSORS: Health problems   Medication change or noncompliance   Traumatic event     PATIENT STRENGTHS: Ability for insight  Capable of independent living  General fund of knowledge  Religious Affiliation    PATIENT IDENTIFIED PROBLEMS: Suicidal ideation  Lack of ability to concentrate  Lack of coping skills r/t anxiety                 DISCHARGE CRITERIA:  Adequate post-discharge living arrangements Need for constant or close observation no longer present Safe-care adequate arrangements made  PRELIMINARY DISCHARGE PLAN: Return to previous living arrangement Return to previous work or school arrangements  PATIENT/FAMILY INVOLVEMENT: This treatment plan has been presented to and reviewed with the patient, Desiree Torres, and/or family member, .  The patient and family have been given the opportunity to ask questions and make suggestions.  Virgel Paling, RN 07/03/2022, 3:01 PM

## 2022-07-03 NOTE — Progress Notes (Signed)
   07/03/22 2216  Psych Admission Type (Psych Patients Only)  Admission Status Voluntary  Psychosocial Assessment  Patient Complaints Depression  Eye Contact Fair  Facial Expression Flat  Affect Appropriate to circumstance  Speech Logical/coherent  Interaction Minimal  Motor Activity Other (Comment) (WDL)  Appearance/Hygiene Unremarkable  Behavior Characteristics Appropriate to situation  Mood Depressed  Thought Process  Coherency WDL  Content WDL  Delusions None reported or observed  Perception WDL  Hallucination None reported or observed  Judgment Limited  Confusion None  Danger to Self  Current suicidal ideation? Passive  Self-Injurious Behavior No self-injurious ideation or behavior indicators observed or expressed   Agreement Not to Harm Self Yes  Description of Agreement verbal  Danger to Others  Danger to Others None reported or observed

## 2022-07-03 NOTE — ED Notes (Signed)
Pt sleeping no distress noted respirations are easy.  

## 2022-07-03 NOTE — Progress Notes (Signed)
RN received report, patient currently resting in reclined chair with even and unlabored respirations. No objective signs of distress.

## 2022-07-03 NOTE — ED Notes (Signed)
Pt was given breakfast  ?

## 2022-07-03 NOTE — Progress Notes (Addendum)
Pt is a 67 female coming from [facility]. She states pain 5/10 in her jaw. She has osteoarthritis in her jaw and a prosthetic jaw. Pt is allergic to morphine and pork. Pt is on multiple medications at home and was not able to list them. Pt states she has been having headache everyday for the last few months. Pt was diagnosed with anxiety, depression, and bipolar many years ago. Pt states she also has fluid on her heart. Pt has a surgical history of a gastric sleeve, twisted intestines correction, and hernia. Pt endorses being sexually active with men. Pt states she smokes 4-5 packs a day and has been trying to quit. Pt drinks alcohol occasionally and socially. Pt uses marijuana. She had a medical card in [place] where she is from. Pt states it has been a few years since she had a period. Pt sees PCP Maury Dus at [facility] and sees them regularly. Pt states she has lost weight recently due to some stomach problems she has been having but could not give a time or weight. Pt states she has trouble concentrating. Pt states she is not currently suicidal but had suicidal thoughts yesterday. Pt states she can't sleep because she is afraid "something is gonna happen, people are jealous of me". Pt lives alone and her main support is her husband. Pt is currently in school to get her degree in criminal justice. Pt states she wants to work on improving her concentration and coping with anxiety.  Pt came to [facility] due to worsening suicidal ideation. She was having racing thoughts about wanting to die and told her therapist who had her sent to the [facility]. She has been wanting to die for several months. She states she has been feeling this way due to being lonely after a recent move and feeling like people are out to get her and doesn't trust anyone. Pt is pleasant and complains she has negative thinking. Pt reports she was hospitalized last year in [place] for similar reasons and also had homicidal ideations about her husband and another  person. Pt currently denies HI/AVH. Pt remains safe on Q15 min checks and contracts for safety.     07/03/22 1458  Psych Admission Type (Psych Patients Only)  Admission Status Voluntary  Psychosocial Assessment  Patient Complaints Depression;Anger;Hopelessness;Irritability;Worrying  Eye Contact Fair  Facial Expression Flat  Affect Appropriate to circumstance  Speech Logical/coherent;Soft  Interaction Assertive;Attention-seeking  Motor Activity Slow  Appearance/Hygiene Unremarkable  Behavior Characteristics Cooperative;Anxious  Mood Depressed;Anxious  Thought Process  Coherency WDL  Content WDL  Delusions None reported or observed  Perception WDL  Hallucination None reported or observed  Judgment Limited  Confusion None  Danger to Self  Current suicidal ideation? Passive  Self-Injurious Behavior No self-injurious ideation or behavior indicators observed or expressed   Agreement Not to Harm Self Yes  Description of Agreement verbal  Danger to Others  Danger to Others None reported or observed

## 2022-07-03 NOTE — Progress Notes (Signed)
Patient ID: Desiree Torres, female   DOB: April 09, 1956, 66 y.o.   MRN: [id number]  Patient only seen once this date by this therapist and that Note has already been completed, and Encounter signed and submitted.

## 2022-07-03 NOTE — Progress Notes (Signed)
Patient is alert and oriented X 4, with passive SI, and complains of depression. Patient is pleasant with calm demeanor on unit, speaks logically and appropriately to the staff. Patient does not appear to be responding to internal stimuli, but affect is flat. Patient refused Cymbalta this morning stating, " I take it at night because it makes me sleepy."  Patient does state she has jaw pain intermittently and was seeing using  acupuncture as well as a cream for pain relief. Nursing staff will continue to monitor patient on the unit.

## 2022-07-04 ENCOUNTER — Encounter (HOSPITAL_COMMUNITY): Payer: Self-pay | Admitting: Family

## 2022-07-04 LAB — PROLACTIN: Prolactin: 6.5 ng/mL (ref 4.8–23.3)

## 2022-07-04 MED ORDER — GABAPENTIN 100 MG PO CAPS
100.0000 mg | ORAL_CAPSULE | Freq: Three times a day (TID) | ORAL | Status: DC
  Administered 2022-07-04 – 2022-07-05 (×3): 100 mg via ORAL
  Filled 2022-07-04 (×9): qty 1

## 2022-07-04 MED ORDER — ARIPIPRAZOLE 15 MG PO TABS
15.0000 mg | ORAL_TABLET | Freq: Every day | ORAL | Status: DC
  Administered 2022-07-04 – 2022-07-06 (×3): 15 mg via ORAL
  Filled 2022-07-04 (×6): qty 1

## 2022-07-04 NOTE — Progress Notes (Signed)
   07/04/22 2141  Psych Admission Type (Psych Patients Only)  Admission Status Voluntary  Psychosocial Assessment  Patient Complaints Depression  Eye Contact Fair  Facial Expression Flat  Affect Appropriate to circumstance  Speech Logical/coherent  Interaction Assertive  Motor Activity Other (Comment) (WDL)  Appearance/Hygiene Unremarkable  Behavior Characteristics Appropriate to situation  Mood Pleasant  Thought Process  Coherency WDL  Content WDL  Delusions None reported or observed  Perception WDL  Hallucination None reported or observed  Judgment Limited  Confusion None  Danger to Self  Current suicidal ideation? Denies  Self-Injurious Behavior No self-injurious ideation or behavior indicators observed or expressed   Agreement Not to Harm Self Yes  Description of Agreement verbal  Danger to Others  Danger to Others None reported or observed

## 2022-07-04 NOTE — Progress Notes (Signed)
Adult Psychoeducational Group Note  Pt did not attend the orientation group. 
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
## 2022-07-04 NOTE — BHH Suicide Risk Assessment (Signed)
B[facility] INPATIENT:  Family/Significant Other Suicide Prevention Education  Suicide Prevention Education:  Education Completed; Desiree Torres, son, [phone] has been identified by the patient as the family member/significant other with whom the patient will be residing, and identified as the person(s) who will aid the patient in the event of a mental health crisis (suicidal ideations/suicide attempt).  With written consent from the patient, the family member/significant other has been provided the following suicide prevention education, prior to the and/or following the discharge of the patient.  In addition to the precautions below, patient's support agrees to monitor patient for safety, ensure treatment compliance, and alert emergency services should patient require such services. States he lives in [place] though does agree to call patient after discharge in order to check in on her. Son expresses no further concerns about patient safety at this time.   The suicide prevention education provided includes the following: Suicide risk factors Suicide prevention and interventions National Suicide Hotline telephone number [facility] assessment telephone number [facility] Emergency Assistance 911 [facility] and/or Residential Mobile Crisis Unit telephone number  Request made of family/significant other to: Remove weapons (e.g., guns, rifles, knives), all items previously/currently identified as safety concern.   Remove drugs/medications (over-the-counter, prescriptions, illicit drugs), all items previously/currently identified as a safety concern.  The family member/significant other verbalizes understanding of the suicide prevention education information provided.  The family member/significant other agrees to remove the items of safety concern listed above.  [name] 07/04/2022, 1:29 PM
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
## 2022-07-04 NOTE — Plan of Care (Signed)
Nurse discussed anxiety, depression and coping skills with patient.  

## 2022-07-04 NOTE — Progress Notes (Signed)
Patient stated she has no appetite.  Patient starts to think about her problems and is not able to eat.  Ate one half cup grits and one egg this morning.  At lunch, patient ate one sweet potato. Patient drank one cup coffee.    Patient ate popsicle and some popcorn.  Drank one cup water.  Patient thinks she has drank 3 cups of water today.

## 2022-07-04 NOTE — Group Note (Signed)
LCSW Group Therapy Note  07/04/2022   10:30-11:30am   Topic:  Anger and its Underlying Emotions  Participation Level:  Active  Description of Group:   In this group, patients identified the primary emotions they often have in situations that eventually provoke them to anger.  Focus was placed on how helpful it is to recognize the underlying emotions to anger in order to address these for more permanent resolution.  Emphasis was also on identifying possible replacement thoughts for the automatic thoughts generated in various situations shared by the group.  Therapeutic Goals: Patients will share emotions that commonly incite their anger and how they typically respond Patients will identify how their coping skills work for them and/or against them Patients will explore possible alternative thoughts to their automatic ones Patients will learn that anger itself is normal and that healthier reactions can assist with resolving conflict rather than worsening situations  Summary of Patient Progress:  The patient shared that her frequent precursor emotion to anger is feeling judged when people say things to her.  She participated fully and with a positive, learning attitude.  Patient indicated a willingness to try working on the underlying emotions in order to feel better both in the short-term and in the long-run.  Therapeutic Modalities:   Cognitive Behavioral Therapy Processing  Lynnell Chad, MSW, LCSW
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
## 2022-07-04 NOTE — Progress Notes (Signed)
Adult Psychoeducational Group Note  Date:  07/04/2022 Time:  8:25 PM  Group Topic/Focus:  Wrap-Up Group:   The focus of this group is to help patients review their daily goal of treatment and discuss progress on daily workbooks.  Participation Level:  Active  Participation Quality:  Appropriate  Affect:  Appropriate  Cognitive:  Appropriate  Insight: Appropriate  Engagement in Group:  Developing/Improving  Modes of Intervention:  Discussion  Additional Comments:  Pt stated her goal for today was to focus on her treatment plan and get some rest. Pt stated she accomplished her goals today. Pt stated she talked with her doctor and with her social worker about her care today. Pt rated her overall day a 6 out of 10. Pt stated she was able to contact her sister which improved her overall day. Pt stated she felt better about herself tonight. Pt stated she was able to attend all meals today. Pt stated she took all medications provided today. Pt stated her appetite was poor today. Pt rated her sleep last night was fair. Pt stated the goal tonight was to get some rest. Pt stated she had some physical pain tonight. Pt stated she had some moderate pain in her jaw bone. Pt rated the moderate pain in her mouth a 5 on the pain level scale. Pt nurse was updated on the situation. Pt deny visual hallucinations and auditory issues tonight. Pt denies thoughts of harming herself or others. Pt stated she would alert staff if anything changed.  Felipa Furnace 07/04/2022, 8:25 PM

## 2022-07-04 NOTE — Progress Notes (Signed)
D:  Patient's self inventory sheet, patient has fair sleep, no sleep medication.  Poor appetite, normal energy level, poor concentration.  Rated depression and hopeless 6, anxiety 9.  Denied withdrawals, checked irritability, agitation.  Denied physical problems, checked pain, headaches, jaw pain.  Pain #6 in past 24 hours.  Tylenol, for headaches.  Goal is "staying out of my head.  Will try to stay busy.  Need something for the patents to do between groups." A:  Medications administered per MD orders.  Emotional support and encouragement given patient. R:  Denied SI and HI, contracts for safety.  Denied A/V hallucinations.  Safety maintained with 15 minute checks.

## 2022-07-05 MED ORDER — GABAPENTIN 100 MG PO CAPS
100.0000 mg | ORAL_CAPSULE | Freq: Two times a day (BID) | ORAL | Status: DC
Start: 2022-07-05 — End: 2022-07-07
  Administered 2022-07-05 – 2022-07-07 (×4): 100 mg via ORAL
  Filled 2022-07-05 (×8): qty 1

## 2022-07-05 MED ORDER — ENSURE ENLIVE PO LIQD
237.0000 mL | Freq: Two times a day (BID) | ORAL | Status: DC
  Administered 2022-07-05 – 2022-07-06 (×4): 237 mL via ORAL
  Filled 2022-07-05 (×9): qty 237

## 2022-07-05 MED ORDER — LINACLOTIDE 145 MCG PO CAPS
145.0000 ug | ORAL_CAPSULE | Freq: Every day | ORAL | Status: DC
  Administered 2022-07-06 – 2022-07-07 (×2): 145 ug via ORAL
  Filled 2022-07-05 (×4): qty 1

## 2022-07-05 MED ORDER — HYDROXYZINE HCL 50 MG PO TABS
50.0000 mg | ORAL_TABLET | Freq: Every day | ORAL | Status: DC
  Administered 2022-07-05 – 2022-07-06 (×2): 50 mg via ORAL
  Filled 2022-07-05 (×4): qty 1

## 2022-07-05 MED ORDER — POLYETHYLENE GLYCOL 3350 17 G PO PACK
17.0000 g | PACK | Freq: Every day | ORAL | Status: DC
  Administered 2022-07-05 – 2022-07-06 (×2): 17 g via ORAL
  Filled 2022-07-05 (×5): qty 1

## 2022-07-05 MED ORDER — ATORVASTATIN CALCIUM 20 MG PO TABS
20.0000 mg | ORAL_TABLET | Freq: Every day | ORAL | Status: DC
  Administered 2022-07-05 – 2022-07-07 (×3): 20 mg via ORAL
  Filled 2022-07-05 (×5): qty 1

## 2022-07-05 MED ORDER — DULOXETINE HCL 30 MG PO CPEP
30.0000 mg | ORAL_CAPSULE | Freq: Every day | ORAL | Status: DC
  Administered 2022-07-05 – 2022-07-06 (×2): 30 mg via ORAL
  Filled 2022-07-05 (×4): qty 1

## 2022-07-05 MED ORDER — SENNOSIDES-DOCUSATE SODIUM 8.6-50 MG PO TABS
1.0000 | ORAL_TABLET | Freq: Every day | ORAL | Status: DC
  Administered 2022-07-05 – 2022-07-07 (×3): 1 via ORAL
  Filled 2022-07-05 (×5): qty 1

## 2022-07-05 NOTE — Progress Notes (Signed)
   07/05/22 2301  Psych Admission Type (Psych Patients Only)  Admission Status Voluntary  Psychosocial Assessment  Patient Complaints Anxiety  Eye Contact Fair  Facial Expression Animated  Affect Appropriate to circumstance  Speech Logical/coherent  Interaction Assertive  Motor Activity Other (Comment) (WDL)  Appearance/Hygiene Unremarkable  Behavior Characteristics Appropriate to situation  Mood Pleasant  Thought Process  Coherency WDL  Content WDL  Delusions None reported or observed  Perception WDL  Hallucination None reported or observed  Judgment Limited  Confusion None  Danger to Self  Current suicidal ideation? Denies  Self-Injurious Behavior No self-injurious ideation or behavior indicators observed or expressed   Agreement Not to Harm Self Yes  Description of Agreement verbal  Danger to Others  Danger to Others None reported or observed

## 2022-07-05 NOTE — Progress Notes (Signed)
Greyson  Laying BP  149/72   P 54    R 16    T 98.4     O2  100%  Sitting    BP 136/79   P 53   R 16    O2 100%  Stand     BP 129/79    P 60    R 16    O2 100%

## 2022-07-05 NOTE — Group Note (Signed)
[facility] LCSW Group Therapy Note  Date/Time:  07/05/2022 10:00am-11:00am  Type of Therapy and Topic:  Group Therapy:  Healthy and Unhealthy Supports  Participation Level:  Active   Description of Group:  Patients in this group were introduced to the idea of adding a variety of healthy supports to address the various needs in their lives.  Patients discussed what additional healthy supports could be helpful in their recovery and wellness after discharge in order to prevent future hospitalizations such as counselor, doctor, other levels of psychiatric care such as ACTT services, therapy groups, 12-step groups, and problem-specific support groups.  A demonstration was given about how to set boundaries which patients expressed was beneficial.  Several songs were played to inspire patients to be more self-supportive.  Therapeutic Goals:   1)  discuss importance of adding supports to stay well once out of the hospital  2)  compare healthy versus unhealthy supports and identify some examples of each  3)  generate ideas and descriptions of healthy supports that can be added  4)  offer mutual support about how to address unhealthy supports  5)  encourage active participation in and adherence to discharge plan    Summary of Patient Progress:  The patient stated that current healthy supports in her life are her son and 2 sisters.  The patient expressed herself rarely in group, but when she did it was with passion.  Another patient was lamenting his homelessness and expressing hopelessness about remaining sober while homeless.  She did not speak for a long time, allowing others to provide suggestions.  When she did talk, she spoke of her own experience with homelessness and alcohol and talked of setting a goal/deciding to do without alcohol.   Therapeutic Modalities:   Motivational Interviewing Brief Solution-Focused Therapy  Ambrose Mantle, LCSW

## 2022-07-05 NOTE — Progress Notes (Signed)
D:  Patient denied SI and HI, contracts for safety.  Denied A/V hallucinations.  Denied pain. A:  Medications administered per MD orders.  Emotional support and encouragement given patient.  . R:  Safety maintained with 15 minute checks.  Patient stated she slept about 5 hours, off/on.  "Doing better today, feeling better today than yesterday."

## 2022-07-05 NOTE — Progress Notes (Signed)
Patient's self inventory sheet, patient has fair sleep, no sleep medicine given.  Poor appetite, normal energy level, good concentration.  Rated depression 5, hopeless 1, anxiety 4.  Denied withdrawals.  Stated she has felt irritable.  Denied SI.  Physical problems, lightheaded, pain.  Jaw pain, #5.  Goal is sleep.  Plans to rest.  No discharge plan.

## 2022-07-05 NOTE — Progress Notes (Signed)
[facility] MD Progress Note  07/05/2022 7:29 AM Desiree Torres  MRN:  [id number] Principal Problem: Bipolar 2 disorder (HCC) Diagnosis: Principal Problem:   Bipolar 2 disorder (HCC)   Reason for Admission: Desiree Torres is a 66 y.o. female with type II bipolar disorder, GAD, hypertension, GERD, constipation who presented to [facility] voluntarily due to SI and racing thoughts secondary to psychotropic medication noncompliance.This is hospitalization day 2.  Subjective:  Patient was seen and assessed at bedside.  Patient reports having intermittent sleep throughout the night.  Reports waking up secondary to racing thoughts.  Also endorsing some dizziness and still having poor appetite.  Patient does report that her mood as well as anxiety have mildly improved compared to yesterday.  Denies SI/HI/AVH.  Patient does report that her neuropathic jaw pain appears to have improved with gabapentin.  Patient reports last bowel movement 3 days ago.  Discussed medication management and patient was agreeable to the following changes to her medications: -Starting hydroxyzine 50 mg nightly for insomnia -Gabapentin 3 times daily to twice daily to reduce daytime sleepiness -Decrease Cymbalta to 30 mg due to concerns of a contributing to racing thoughts -Start bowel regimen: Linzess, Senokot, MiraLAX -Discussed R/B/SE regarding all medication changes and patient was amenable to current medication regiment    Objective:  Chart Review Past 24 hours of patient's chart was reviewed.  Patient is compliant with scheduled meds. Required Agitation PRNs: none Per RN notes, no documented behavioral issues and is  attending group. Patient slept, undocumented hours  Total Time spent with patient: 45 minutes  Past Psychiatric History: see H&P  Past Medical History:  Past Medical History:  Diagnosis Date   Bipolar depression (HCC)    Hypertension     History reviewed. No pertinent surgical history. Family History:   Family History  Problem Relation Age of Onset   Heart disease Mother    Cancer Father    Heart disease Sister    Family Psychiatric  History: see H&P Social History:  Social History   Substance and Sexual Activity  Alcohol Use Yes   Comment: reports occasional drink in social context     Social History   Substance and Sexual Activity  Drug Use Yes   Types: Marijuana    Social History   Socioeconomic History   Marital status: Legally Separated    Spouse name: Not on file   Number of children: 4   Years of education: Not on file   Highest education level: Not on file  Occupational History   Not on file  Tobacco Use   Smoking status: Every Day    Packs/day: 0.25    Years: 25.00    Total pack years: 6.25    Types: Cigarettes   Smokeless tobacco: Never  Vaping Use   Vaping Use: Never used  Substance and Sexual Activity   Alcohol use: Yes    Comment: reports occasional drink in social context   Drug use: Yes    Types: Marijuana   Sexual activity: Not on file  Other Topics Concern   Not on file  Social History Narrative   Not on file   Social Determinants of Health   Financial Resource Strain: Not on file  Food Insecurity: Not on file  Transportation Needs: Not on file  Physical Activity: Not on file  Stress: Not on file  Social Connections: Not on file   Additional Social History:  Current Medications: Current Facility-Administered Medications  Medication Dose Route Frequency Provider Last Rate Last Admin   acetaminophen (TYLENOL) tablet 650 mg  650 mg Oral Q6H PRN Lenard Lance, FNP   650 mg at 07/04/22 0758   alum & mag hydroxide-simeth (MAALOX/MYLANTA) 200-200-20 MG/5ML suspension 30 mL  30 mL Oral Q4H PRN Lenard Lance, FNP       ARIPiprazole (ABILIFY) tablet 15 mg  15 mg Oral Seabron Spates, MD   15 mg at 07/04/22 2107   dorzolamide-timolol (COSOPT) 22.3-6.8 MG/ML ophthalmic solution 1 drop  1 drop Both Eyes BID  Lenard Lance, FNP   1 drop at 07/04/22 1621   DULoxetine (CYMBALTA) DR capsule 60 mg  60 mg Oral QHS Attiah, Nadir, MD   60 mg at 07/04/22 2106   erythromycin (ERY-TAB) EC tablet 250 mg  250 mg Oral TID Lenard Lance, FNP   250 mg at 07/04/22 1622   gabapentin (NEURONTIN) capsule 100 mg  100 mg Oral TID Park Pope, MD   100 mg at 07/04/22 1624   irbesartan (AVAPRO) tablet 300 mg  300 mg Oral Daily Attiah, Nadir, MD   300 mg at 07/04/22 0751   And   hydrochlorothiazide (HYDRODIURIL) tablet 12.5 mg  12.5 mg Oral Daily Attiah, Nadir, MD   12.5 mg at 07/04/22 0751   magnesium hydroxide (MILK OF MAGNESIA) suspension 30 mL  30 mL Oral Daily PRN Lenard Lance, FNP       multivitamin with minerals tablet 1 tablet  1 tablet Oral Daily Lenard Lance, FNP   1 tablet at 07/04/22 0757   nicotine (NICODERM CQ - dosed in mg/24 hours) patch 14 mg  14 mg Transdermal Daily Lenard Lance, FNP   14 mg at 07/04/22 0756   omeprazole (PRILOSEC) capsule 40 mg  40 mg Oral Daily Attiah, Nadir, MD   40 mg at 07/04/22 0752   traZODone (DESYREL) tablet 50 mg  50 mg Oral QHS Lenard Lance, FNP   50 mg at 07/04/22 2107    Lab Results:  No results found for this or any previous visit (from the past 24 hour(s)).  Blood Alcohol level:  Lab Results  Component Value Date   ETH <10 07/03/2022    Metabolic Disorder Labs: Lab Results  Component Value Date   HGBA1C 4.9 07/03/2022   MPG 93.93 07/03/2022   Lab Results  Component Value Date   PROLACTIN 6.5 07/03/2022   Lab Results  Component Value Date   CHOL 187 07/03/2022   TRIG 89 07/03/2022   HDL 62 07/03/2022   CHOLHDL 3.0 07/03/2022   VLDL 18 07/03/2022   LDLCALC 107 (H) 07/03/2022    Physical Findings: AIMS: will evaluate tomorrow  Musculoskeletal: Strength & Muscle Tone: within normal limits Gait & Station: normal  Psychiatric Specialty Exam:  Presentation  General Appearance: Appropriate for Environment; Casual   Eye  Contact:Good   Speech:Clear and Coherent; Normal Rate   Speech Volume:Normal   Handedness:Right    Mood and Affect  Mood:Depressed; Anxious   Affect:Depressed; Labile    Thought Process  Thought Processes:Coherent; Goal Directed; Linear   Descriptions of Associations:Circumstantial   Orientation:Full (Time, Place and Person)   Thought Content:Logical; WDL   History of Schizophrenia/Schizoaffective disorder:No   Duration of Psychotic Symptoms:No data recorded  Hallucinations:Hallucinations: None  Ideas of Reference:None   Suicidal Thoughts:Suicidal Thoughts: No  Homicidal Thoughts:Homicidal Thoughts: No   Sensorium  Memory:Immediate Good; Recent Good; Remote Good  Judgment:Fair   Insight:Fair    Executive Functions  Concentration:Good   Attention Span:Good   Recall:Good   Fund of Knowledge:Good   Language:Good    Psychomotor Activity  Psychomotor Activity:Psychomotor Activity: Normal   Assets  Assets:Communication Skills; Desire for Improvement; Financial Resources/Insurance; Housing; Leisure Time; Physical Health; Resilience; Social Support; Talents/Skills; Vocational/Educational    Sleep  Sleep:Sleep: Fair    Physical Exam: ROS Blood pressure 112/75, pulse 77, temperature 98.6 F (37 C), temperature source Oral, resp. rate 15, height 5\' 5"  (1.651 m), weight 62 kg, SpO2 100 %. Body mass index is 22.73 kg/m.   Treatment Plan Summary: Shadana Masaki is a 66 y.o. female with type II bipolar disorder, GAD, hypertension, GERD, constipation who presented to [facility] voluntarily due to [place] and racing thoughts secondary to psychotropic medication noncompliance for past 2 months.   Safety and Monitoring: voluntarily admission to inpatient psychiatric unit for safety, stabilization and treatment Daily contact with patient to assess and evaluate symptoms and progress in treatment Appropriate medication management to further  stabilize patient Patient's case will be regularly discussed in multi-disciplinary team meeting Observation Level : q15 minute checks Vital signs: q12 hours Precautions: suicide, elopement, and assault   2. Psychiatric Problems Type II bipolar disorder PTSD GAD -Decrease Cymbalta 60 mg to 30 mg nightly due to concerns that it is contributing to her racing thoughts -Continue Abilify 15 mg for mood stabilization as well as racing thoughts.  No electrolyte abnormalities noted on CMP. ECG ordered to monitor heart function. Recommend starting LAI once patient stabilized. -Continue trazodone 50 mg nightly for insomnia (patient did not want to increase due to daytime sedation) -Start hydroxyzine 50 mg nightly for insomnia -Switch gabapentin 100 mg 3 times daily to twice daily for anxiety and neuropathic pain   Nicotine Use Disorder -Plan to discharge with Chantix for tobacco cessation -NRT with nicotine patch   Cannabis Use Disorder -Encourage cessation which may be possible if anxiety and pain are better controlled     3. 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 Last BM 2 days ago -Outpatient GI follow-up and gastric emptying study -Continue erythromycin 250 mg 3 times daily -Start Linzess 145 mcg daily before breakfast -Start scheduled MiraLAX plus Senokot  Malnutrition -Start Ensure supplementation   Hypertension Normotensive at this time -Continue formulary alternative for Micardis HCT (Avapro 300 + HCTZ 12.5)   Pericardial Effusion Patient reports to be mild -Outpatient follow-up   Hypercholesterolemia 10 year ASCVD risk 16.8% (intermediate) -Will discuss starting a low intensity statin tomorrow     PRN -Tylenol 650 mg q6hr PRN for mild pain -Mylanta 30 ml  suspension for indigestion -Milk of Magnesia 30 ml for constipation -Trazodone 50 mg qhs for insomnia -Hydroxyzine 25 mg tid PRN for anxiety   4. Discharge Planning Patient will require the following based on my assessment:  Greatly appreciate CSW and Case management assistance with facilitating these needs and any further recommendations regarding patient's needs upon discharge. Estimated LOS: 5-7 days Discharge Concerns: Need to establish a safety plan; Medication compliance and effectiveness Discharge Goals: Return home with outpatient referrals for mental health follow-up including medication management/psychotherapy     France Ravens, MD 07/05/2022, 7:29 AM

## 2022-07-05 NOTE — Progress Notes (Signed)
Adult Psychoeducational Group Note  Date:  07/05/2022 Time:  9:21 PM  Group Topic/Focus:  Wrap-Up Group:   The focus of this group is to help patients review their daily goal of treatment and discuss progress on daily workbooks.  Participation Level:  Active  Participation Quality:  Appropriate  Affect:  Appropriate  Cognitive:  Appropriate  Insight: Appropriate  Engagement in Group:  Engaged  Modes of Intervention:  Discussion  Additional Comments:  Patid tsaid her day 7. Her goal get rest and she acieved goal. Coping skills siscipline self make her self to rest.  Charna Busman Long 07/05/2022, 9:21 PM

## 2022-07-05 NOTE — Group Note (Signed)
Date:  07/05/2022 Time:  4:11 PM  Group Topic/Focus:  Crisis Planning:   The purpose of this group is to help patients create a crisis plan for use upon discharge or in the future, as needed.    Participation Level:  Minimal  Participation Quality:  Appropriate  Affect:  Appropriate  Cognitive:  Appropriate  Insight: Good  Engagement in Group:  Lacking  Modes of Intervention:  Education  Additional Comments:  Educated patient in ways to identify mental health crisis, such as out of control behaviors, feelings of anxiety and depression.  Reymundo Poll 07/05/2022, 4:11 PM

## 2022-07-05 NOTE — Plan of Care (Signed)
Nurse discussed anxiety, depression and coping skills with patient.  

## 2022-07-06 ENCOUNTER — Encounter (HOSPITAL_COMMUNITY): Payer: Self-pay

## 2022-07-06 DIAGNOSIS — F3181 Bipolar II disorder: Secondary | ICD-10-CM | POA: Diagnosis not present

## 2022-07-06 MED ORDER — LORAZEPAM 1 MG PO TABS
2.0000 mg | ORAL_TABLET | ORAL | Status: AC
  Administered 2022-07-06: 2 mg via ORAL
  Filled 2022-07-06: qty 2

## 2022-07-06 NOTE — [facility] Group Notes (Signed)
Group notes Pt attended and contributed to group 

## 2022-07-06 NOTE — Progress Notes (Signed)
Adult Psychoeducational Group Note  Date:  07/06/2022 Time:  9:44 PM  Group Topic/Focus:  AA  Participation Level:  Did Not Attend  Participation Quality:   [name]  Affect:   [name]  Cognitive:   [name]  Insight: None  Engagement in Group:   [name]  Modes of Intervention:   [name]  Additional Comments:   Pt did not attend the AA group  Jerred Zaremba M Carden Teel 07/06/2022, 9:44 PM

## 2022-07-06 NOTE — Progress Notes (Signed)
72 HR REQUEST FOR DISCHARGE SIGNED ON   07/06/2022   AT   1125.

## 2022-07-06 NOTE — Progress Notes (Signed)
Patient very upset because she is not being discharged today.  Ativan 2 mg po administered to patient per MD order.  Patient's phone brought to her from locker so she can call friend to feed her cat.  Patient is calm and cooperative and went to dining room for lunch.

## 2022-07-06 NOTE — BH IP Treatment Plan (Signed)
Interdisciplinary Treatment and Diagnostic Plan Update  07/06/2022 Time of Session: 0830 Desiree Torres MRN: [id number]  Principal Diagnosis: Bipolar 2 disorder [facility])  Secondary Diagnoses: Principal Problem:   Bipolar 2 disorder (HCC)   Current Medications:  Current Facility-Administered Medications  Medication Dose Route Frequency Provider Last Rate Last Admin   acetaminophen (TYLENOL) tablet 650 mg  650 mg Oral Q6H PRN Lenard Lance, FNP   650 mg at 07/04/22 0758   alum & mag hydroxide-simeth (MAALOX/MYLANTA) 200-200-20 MG/5ML suspension 30 mL  30 mL Oral Q4H PRN Lenard Lance, FNP       ARIPiprazole (ABILIFY) tablet 15 mg  15 mg Oral QHS Park Pope, MD   15 mg at 07/05/22 2103   atorvastatin (LIPITOR) tablet 20 mg  20 mg Oral Daily Park Pope, MD   20 mg at 07/06/22 0739   dorzolamide-timolol (COSOPT) 22.3-6.8 MG/ML ophthalmic solution 1 drop  1 drop Both Eyes BID Lenard Lance, FNP   1 drop at 07/06/22 0735   DULoxetine (CYMBALTA) DR capsule 30 mg  30 mg Oral Seabron Spates, MD   30 mg at 07/05/22 2103   erythromycin (ERY-TAB) EC tablet 250 mg  250 mg Oral TID Lenard Lance, FNP   250 mg at 07/06/22 0737   feeding supplement (ENSURE ENLIVE / ENSURE PLUS) liquid 237 mL  237 mL Oral BID BM Park Pope, MD   237 mL at 07/05/22 1403   gabapentin (NEURONTIN) capsule 100 mg  100 mg Oral BID Park Pope, MD   100 mg at 07/06/22 0738   irbesartan (AVAPRO) tablet 300 mg  300 mg Oral Daily Attiah, Nadir, MD   300 mg at 07/06/22 8299   And   hydrochlorothiazide (HYDRODIURIL) tablet 12.5 mg  12.5 mg Oral Daily Attiah, Nadir, MD   12.5 mg at 07/06/22 3716   hydrOXYzine (ATARAX) tablet 50 mg  50 mg Oral Seabron Spates, MD   50 mg at 07/05/22 2103   linaclotide (LINZESS) capsule 145 mcg  145 mcg Oral QAC breakfast Park Pope, MD   145 mcg at 07/06/22 9678   magnesium hydroxide (MILK OF MAGNESIA) suspension 30 mL  30 mL Oral Daily PRN Lenard Lance, FNP       multivitamin with minerals tablet 1 tablet   1 tablet Oral Daily Lenard Lance, FNP   1 tablet at 07/06/22 0739   nicotine (NICODERM CQ - dosed in mg/24 hours) patch 14 mg  14 mg Transdermal Daily Lenard Lance, FNP   14 mg at 07/06/22 0736   omeprazole (PRILOSEC) capsule 40 mg  40 mg Oral Daily Attiah, Nadir, MD   40 mg at 07/06/22 0737   polyethylene glycol (MIRALAX / GLYCOLAX) packet 17 g  17 g Oral Daily Park Pope, MD   17 g at 07/05/22 1027   senna-docusate (Senokot-S) tablet 1 tablet  1 tablet Oral Daily Park Pope, MD   1 tablet at 07/06/22 0738   traZODone (DESYREL) tablet 50 mg  50 mg Oral QHS Lenard Lance, FNP   50 mg at 07/05/22 2103   PTA Medications: Medications Prior to Admission  Medication Sig Dispense Refill Last Dose   ARIPiprazole (ABILIFY) 10 MG tablet Take one tablet daily. (Patient taking differently: Take 10 mg by mouth daily.) 30 tablet 5    Ascorbic Acid (VITAMIN C PO) Take 1 tablet by mouth daily.      buPROPion (WELLBUTRIN XL) 150 MG 24 hr tablet Take 150 mg  by mouth daily.      busPIRone (BUSPAR) 5 MG tablet Take 1 tablet (5 mg total) by mouth 3 (three) times daily. 90 tablet 5    Cholecalciferol (VITAMIN D-3 PO) Take 1 tablet by mouth daily.      diclofenac Sodium (VOLTAREN) 1 % GEL Apply 2 g topically daily.      dorzolamide-timolol (COSOPT) 22.3-6.8 MG/ML ophthalmic solution Place 1 drop into both eyes 2 (two) times daily.      DULoxetine (CYMBALTA) 30 MG capsule Take 30 mg by mouth at bedtime.      erythromycin (ERY-TAB) 250 MG EC tablet Take 250 mg by mouth 3 (three) times daily.      gabapentin (NEURONTIN) 300 MG capsule Take 300 mg by mouth daily.      Multiple Vitamin (MULTIVITAMIN WITH MINERALS) TABS tablet Take 1 tablet by mouth daily.      naproxen sodium (ALEVE) 220 MG tablet Take 440 mg by mouth 2 (two) times daily as needed (For headache).      omeprazole (PRILOSEC) 40 MG capsule Take 40 mg by mouth every morning.      SYSTANE ULTRA 0.4-0.3 % SOLN Place 1 drop into both eyes 2 (two) times  daily.      telmisartan-hydrochlorothiazide (MICARDIS HCT) 80-12.5 MG tablet Take 1 tablet by mouth daily.      traZODone (DESYREL) 100 MG tablet Take 2-3 tablets at bedtime for sleep. (Patient taking differently: Take 200-300 mg by mouth at bedtime.) 30 tablet 5    VITAMIN E PO Take 1 capsule by mouth daily.       Patient Stressors: Health problems   Medication change or noncompliance   Traumatic event    Patient Strengths: Ability for insight  Capable of independent living  General fund of knowledge  Religious Affiliation   Treatment Modalities: Medication Management, Group therapy, Case management,  1 to 1 session with clinician, Psychoeducation, Recreational therapy.   Physician Treatment Plan for Primary Diagnosis: Bipolar 2 disorder (HCC) Long Term Goal(s):     Short Term Goals:    Medication Management: Evaluate patient's response, side effects, and tolerance of medication regimen.  Therapeutic Interventions: 1 to 1 sessions, Unit Group sessions and Medication administration.  Evaluation of Outcomes: Progressing  Physician Treatment Plan for Secondary Diagnosis: Principal Problem:   Bipolar 2 disorder (HCC)  Long Term Goal(s):     Short Term Goals:       Medication Management: Evaluate patient's response, side effects, and tolerance of medication regimen.  Therapeutic Interventions: 1 to 1 sessions, Unit Group sessions and Medication administration.  Evaluation of Outcomes: Progressing   RN Treatment Plan for Primary Diagnosis: Bipolar 2 disorder (HCC) Long Term Goal(s): Knowledge of disease and therapeutic regimen to maintain health will improve  Short Term Goals: Ability to remain free from injury will improve, Ability to verbalize frustration and anger appropriately will improve, Ability to demonstrate self-control, Ability to participate in decision making will improve, Ability to verbalize feelings will improve, Ability to disclose and discuss suicidal  ideas, Ability to identify and develop effective coping behaviors will improve, and Compliance with prescribed medications will improve  Medication Management: RN will administer medications as ordered by provider, will assess and evaluate patient's response and provide education to patient for prescribed medication. RN will report any adverse and/or side effects to prescribing provider.  Therapeutic Interventions: 1 on 1 counseling sessions, Psychoeducation, Medication administration, Evaluate responses to treatment, Monitor vital signs and CBGs as ordered, Perform/monitor CIWA, COWS, AIMS  and Fall Risk screenings as ordered, Perform wound care treatments as ordered.  Evaluation of Outcomes: Progressing   LCSW Treatment Plan for Primary Diagnosis: Bipolar 2 disorder (HCC) Long Term Goal(s): Safe transition to appropriate next level of care at discharge, Engage patient in therapeutic group addressing interpersonal concerns.  Short Term Goals: Engage patient in aftercare planning with referrals and resources, Increase social support, Increase ability to appropriately verbalize feelings, Increase emotional regulation, Facilitate acceptance of mental health diagnosis and concerns, Facilitate patient progression through stages of change regarding substance use diagnoses and concerns, Identify triggers associated with mental health/substance abuse issues, and Increase skills for wellness and recovery  Therapeutic Interventions: Assess for all discharge needs, 1 to 1 time with Social worker, Explore available resources and support systems, Assess for adequacy in community support network, Educate family and significant other(s) on suicide prevention, Complete Psychosocial Assessment, Interpersonal group therapy.  Evaluation of Outcomes: Progressing   Progress in Treatment: Attending groups: Yes. Participating in groups: Yes. Taking medication as prescribed: Yes. Toleration medication:  Yes. Family/Significant other contact made: No, will contact:  SPE completed with  Candelaria Stagers, son, [phone] Patient understands diagnosis: Yes. Discussing patient identified problems/goals with staff: Yes. Medical problems stabilized or resolved: Yes. Denies suicidal/homicidal ideation: Yes. Issues/concerns per patient self-inventory: Yes. Other: none  New problem(s) identified: No, Describe:  none  New Short Term/Long Term Goal(s): Patient to work towards detox, elimination of symptoms of psychosis, medication management for mood stabilization; elimination of SI thoughts; development of comprehensive mental wellness/sobriety plan.  Patient Goals: Patient simply states she "want(s) to go home."   Discharge Plan or Barriers: No psychosocial barriers identified at this time, patient to return to place of residence when appropriate for discharge.   Reason for Continuation of Hospitalization: Depression  Estimated Length of Stay: 1-7 days   Scribe for Treatment Team: Almedia Balls 07/06/2022 10:59 AM

## 2022-07-06 NOTE — Plan of Care (Signed)
Nurse discussed coping skills with patient.  

## 2022-07-06 NOTE — Group Note (Signed)
[facility] LCSW Group Therapy Note    Group Date: 07/06/2022 Start Time: 1300 End Time: 1400  Type of Therapy and Topic:  Group Therapy:  Overcoming Obstacles  Participation Level:  [facility] PARTICIPATION LEVEL: Did Not Attend  Mood:  Description of Group:   In this group patients will be encouraged to explore what they see as obstacles to their own wellness and recovery. They will be guided to discuss their thoughts, feelings, and behaviors related to these obstacles. The group will process together ways to cope with barriers, with attention given to specific choices patients can make. Each patient will be challenged to identify changes they are motivated to make in order to overcome their obstacles. This group will be process-oriented, with patients participating in exploration of their own experiences as well as giving and receiving support and challenge from other group members.  Therapeutic Goals: 1. Patient will identify personal and current obstacles as they relate to admission. 2. Patient will identify barriers that currently interfere with their wellness or overcoming obstacles.  3. Patient will identify feelings, thought process and behaviors related to these barriers. 4. Patient will identify two changes they are willing to make to overcome these obstacles:    Summary of Patient Progress   Counselor educated patient on the relationships between thoughts, feelings, and behaviors.     Therapeutic Modalities:   Cognitive Behavioral Therapy Solution Focused Therapy Motivational Interviewing Relapse Prevention Therapy   Fionnuala Hemmerich M Brenan [place], LCSW 

## 2022-07-06 NOTE — Progress Notes (Signed)
Desiree Torres was advised by staff they go to group or room. The phones are not on during wrap up group time. Desiree Torres was observed  being defiant ed standing  [address]. Desiree Torres was asked by staff she can not stand  in the hallway while AA group going on.

## 2022-07-06 NOTE — [facility] Group Notes (Signed)
Spiritual care group on grief and loss facilitated by chaplain Dyanne Carrel, [employer]   Group Goal:   Support / Education around grief and loss   Members engage in facilitated group support and psycho-social education.   Group Description:   Following introductions and group rules, group members engaged in facilitated group dialog and support around topic of loss, with particular support around experiences of loss in their lives. Group Identified types of loss (relationships / self / things) and identified patterns, circumstances, and changes that precipitate losses. Reflected on thoughts / feelings around loss, normalized grief responses, and recognized variety in grief experience. Group noted Worden's four tasks of grief in discussion.   Group drew on Adlerian / Rogerian, narrative, MI,   Patient Progress: Patient was present for the beginning of group, but left shortly after group began as she had received a phone call.  [address], Bcc Pager, [phone]

## 2022-07-06 NOTE — Progress Notes (Signed)
Chaplain met Desiree Torres in the hallway and invited her to talk.  She was concerned about her cat who was being cared for by her niece, but her niece said she isn't able to care for the cat anymore.  In general Desiree Torres stated that she was ready to discharge and that she felt that she just needed to get back on her medicine and that now that she has done that, she is ready to go home.  Our visit was brief because she received a phone call from her brother.  Chaplain will stop by tomorrow to see if she wants to talk further.   [address], [place] Pager, [phone]

## 2022-07-06 NOTE — Progress Notes (Signed)
Upon change of shift, pt irritable, states she was "suppose to discharge, and is worried about her cats." Pt states she had a bad day. Pt received vistaril as requested early, states her anxiety is going up, and refused other hs meds at this time. Pt refused to go to group, support and encouragement given. Pt observed standing at bedroom door, talking with other peers across hallway. At bedtime pt was observed resting with respirations even/unlabored. Currently denies SI/HI or hallucinations (a) 15 min checks (r) safety maintained.

## 2022-07-06 NOTE — Group Note (Signed)
Recreation Therapy Group Note   Group Topic:Stress Management  Group Date: 07/06/2022 Start Time: 0930 End Time: 1052 Facilitators: Caroll Rancher, [place] Location: [address] Dayroom   Goal Area(s) Addresses:  Patient will identify positive stress management techniques. Patient will identify benefits of using stress management post d/c.  Group Description:  Meditation.  LRT played a meditation that focused seeing your higher self.  It also speaks on renewing your mind, body and spirit as you go into your day.    Affect/Mood: Appropriate   Participation Level: Active   Participation Quality: Independent   Behavior: Appropriate   Speech/Thought Process: Focused   Insight: Good   Judgement: Good   Modes of Intervention: Meditation   Patient Response to Interventions:  Attentive   Education Outcome:  Acknowledges education and In group clarification offered    Clinical Observations/Individualized Feedback: Pt attended and participated in group session.     Plan: Continue to engage patient in RT group sessions 2-3x/week.   Caroll Rancher, LRT,CTRS  07/06/2022 12:30 PM

## 2022-07-06 NOTE — Progress Notes (Signed)
[name] MD Progress Note  07/06/2022 1:52 PM Desiree Torres  MRN:  [id number] Principal Problem: Bipolar 2 disorder (HCC) Diagnosis: Principal Problem:   Bipolar 2 disorder (HCC)   Reason for Admission: Desiree Torres is a 66 y.o. female with type II bipolar disorder, GAD, hypertension, GERD, constipation who presented to [facility] voluntarily due to SI and racing thoughts secondary to psychotropic medication noncompliance.This is hospitalization day 3.  Subjective:  Patient was seen and assessed in the office. Pt reported she wants to go home, because she needs to take care of her cat. Pt reports feeling better since yesterday (sleep/appetite improved). Denies SI/HI/AVH.  Per nursing note: "Patient very upset because she is not being discharged today.  Ativan 2 mg po administered to patient per MD order.  Patient's phone brought to her from locker so she can call friend to feed her cat.  Patient is calm and cooperative and went to dining room for lunch."  Patient does report that her neuropathic jaw pain appears to have improved with gabapentin.  Patient reports last bowel movement 3 days ago.  Discussed medication management and patient was agreeable to the following changes to her medications yesterday: -Starting hydroxyzine 50 mg nightly for insomnia -Gabapentin 3 times daily to twice daily to reduce daytime sleepiness -Decrease Cymbalta to 30 mg due to concerns of a contributing to racing thoughts -Start bowel regimen: Linzess, Senokot, MiraLAX -Discussed R/B/SE regarding all medication changes and patient was amenable to current medication regiment    Objective:  Chart Review Past 24 hours of patient's chart was reviewed.  Patient is compliant with scheduled meds. Required Agitation PRNs: none Per RN notes, no documented behavioral issues and is  attending group. Patient slept, undocumented hours  Total Time spent with patient: 45 minutes  Past Psychiatric History: see H&P  Past  Medical History:  Past Medical History:  Diagnosis Date   Bipolar depression (HCC)    Hypertension     History reviewed. No pertinent surgical history. Family History:  Family History  Problem Relation Age of Onset   Heart disease Mother    Cancer Father    Heart disease Sister    Family Psychiatric  History: see H&P Social History:  Social History   Substance and Sexual Activity  Alcohol Use Yes   Comment: reports occasional drink in social context     Social History   Substance and Sexual Activity  Drug Use Yes   Types: Marijuana    Social History   Socioeconomic History   Marital status: Legally Separated    Spouse name: Not on file   Number of children: 4   Years of education: Not on file   Highest education level: Not on file  Occupational History   Not on file  Tobacco Use   Smoking status: Every Day    Packs/day: 0.25    Years: 25.00    Total pack years: 6.25    Types: Cigarettes   Smokeless tobacco: Never  Vaping Use   Vaping Use: Never used  Substance and Sexual Activity   Alcohol use: Yes    Comment: reports occasional drink in social context   Drug use: Yes    Types: Marijuana   Sexual activity: Not on file  Other Topics Concern   Not on file  Social History Narrative   Not on file   Social Determinants of Health   Financial Resource Strain: Not on file  Food Insecurity: Not on file  Transportation Needs: Not on file  Physical Activity: Not on file  Stress: Not on file  Social Connections: Not on file   Additional Social History:                         Current Medications: Current Facility-Administered Medications  Medication Dose Route Frequency Provider Last Rate Last Admin   acetaminophen (TYLENOL) tablet 650 mg  650 mg Oral Q6H PRN Lucky Rathke, FNP   650 mg at 07/04/22 0758   alum & mag hydroxide-simeth (MAALOX/MYLANTA) 200-200-20 MG/5ML suspension 30 mL  30 mL Oral Q4H PRN Lucky Rathke, FNP       ARIPiprazole  (ABILIFY) tablet 15 mg  15 mg Oral QHS France Ravens, MD   15 mg at 07/05/22 2103   atorvastatin (LIPITOR) tablet 20 mg  20 mg Oral Daily France Ravens, MD   20 mg at 07/06/22 0739   dorzolamide-timolol (COSOPT) 22.3-6.8 MG/ML ophthalmic solution 1 drop  1 drop Both Eyes BID Lucky Rathke, FNP   1 drop at 07/06/22 0735   DULoxetine (CYMBALTA) DR capsule 30 mg  30 mg Oral Aliene Altes, MD   30 mg at 07/05/22 2103   erythromycin (ERY-TAB) EC tablet 250 mg  250 mg Oral TID Lucky Rathke, FNP   250 mg at 07/06/22 1213   feeding supplement (ENSURE ENLIVE / ENSURE PLUS) liquid 237 mL  237 mL Oral BID BM France Ravens, MD   237 mL at 07/05/22 1403   gabapentin (NEURONTIN) capsule 100 mg  100 mg Oral BID France Ravens, MD   100 mg at 07/06/22 0738   irbesartan (AVAPRO) tablet 300 mg  300 mg Oral Daily Attiah, Nadir, MD   300 mg at 07/06/22 [id number]   And   hydrochlorothiazide (HYDRODIURIL) tablet 12.5 mg  12.5 mg Oral Daily Attiah, Nadir, MD   12.5 mg at 07/06/22 [id number]   hydrOXYzine (ATARAX) tablet 50 mg  50 mg Oral Aliene Altes, MD   50 mg at 07/05/22 2103   linaclotide (LINZESS) capsule 145 mcg  145 mcg Oral QAC breakfast France Ravens, MD   145 mcg at 07/06/22 [id number]   magnesium hydroxide (MILK OF MAGNESIA) suspension 30 mL  30 mL Oral Daily PRN Lucky Rathke, FNP       multivitamin with minerals tablet 1 tablet  1 tablet Oral Daily Lucky Rathke, FNP   1 tablet at 07/06/22 0739   nicotine (NICODERM CQ - dosed in mg/24 hours) patch 14 mg  14 mg Transdermal Daily Lucky Rathke, FNP   14 mg at 07/06/22 0736   omeprazole (PRILOSEC) capsule 40 mg  40 mg Oral Daily Attiah, Nadir, MD   40 mg at 07/06/22 0737   polyethylene glycol (MIRALAX / GLYCOLAX) packet 17 g  17 g Oral Daily France Ravens, MD   17 g at 07/05/22 1027   senna-docusate (Senokot-S) tablet 1 tablet  1 tablet Oral Daily France Ravens, MD   1 tablet at 07/06/22 0738   traZODone (DESYREL) tablet 50 mg  50 mg Oral QHS Lucky Rathke, FNP   50 mg at 07/05/22 2103    Lab  Results:  No results found for this or any previous visit (from the past 24 hour(s)).  Blood Alcohol level:  Lab Results  Component Value Date   ETH <10 [id number]    Metabolic Disorder Labs: Lab Results  Component Value Date   HGBA1C 4.9 07/03/2022   MPG 93.93 07/03/2022  Lab Results  Component Value Date   PROLACTIN 6.5 07/03/2022   Lab Results  Component Value Date   CHOL 187 07/03/2022   TRIG 89 07/03/2022   HDL 62 07/03/2022   CHOLHDL 3.0 07/03/2022   VLDL 18 07/03/2022   LDLCALC 107 (H) 07/03/2022    Physical Findings: AIMS: will evaluate tomorrow  Musculoskeletal: Strength & Muscle Tone: within normal limits Gait & Station: normal  Psychiatric Specialty Exam:  Presentation  General Appearance: Appropriate for Environment; Casual   Eye Contact:Good   Speech:Clear and Coherent; Normal Rate   Speech Volume:Normal   Handedness:Right    Mood and Affect  Mood:Depressed; Anxious   Affect:Depressed; Labile    Thought Process  Thought Processes:Coherent; Goal Directed; Linear   Descriptions of Associations:Circumstantial   Orientation:Full (Time, Place and Person)   Thought Content:Logical; WDL   History of Schizophrenia/Schizoaffective disorder:No   Duration of Psychotic Symptoms:No data recorded  Hallucinations:None  Ideas of Reference:None   Suicidal Thoughts:None   Homicidal Thoughts:None   Sensorium  Memory:Immediate Good; Recent Good; Remote Good   Judgment:Fair   Insight:Fair    Executive Functions  Concentration:Good   Attention Span:Good   [place] of Knowledge:Good   Language:Good    Psychomotor Activity  Psychomotor Activity:No data recorded   Assets  Assets:Communication Skills; Desire for Improvement; Financial Resources/Insurance; Housing; Leisure Time; Physical Health; Resilience; Social Support; Talents/Skills; Vocational/Educational    Sleep  Sleep:No data  recorded    Physical Exam: ROS Blood pressure 129/83, pulse 74, temperature 98.7 F (37.1 C), temperature source Oral, resp. rate 16, height 5\' 5"  (1.651 m), weight 62 kg, SpO2 99 %. Body mass index is 22.73 kg/m.   Treatment Plan Summary: Jessalynn Dagnino is a 66 y.o. female with type II bipolar disorder, GAD, hypertension, GERD, constipation who presented to [facility] voluntarily due to [place] and racing thoughts secondary to psychotropic medication noncompliance for past 2 months.   Safety and Monitoring: voluntarily admission to inpatient psychiatric unit for safety, stabilization and treatment Daily contact with patient to assess and evaluate symptoms and progress in treatment Appropriate medication management to further stabilize patient Patient's case will be regularly discussed in multi-disciplinary team meeting Observation Level : q15 minute checks Vital signs: q12 hours Precautions: suicide, elopement, and assault   2. Psychiatric Problems Type II bipolar disorder PTSD GAD -Decreased Cymbalta 60 mg to 30 mg nightly due to concerns that it is contributing to her racing thoughts -Continue Abilify 15 mg for mood stabilization as well as racing thoughts.  No electrolyte abnormalities noted on CMP. ECG ordered to monitor heart function. Recommend starting LAI once patient stabilized. -Continue trazodone 50 mg nightly for insomnia (patient did not want to increase due to daytime sedation) -Continue hydroxyzine 50 mg nightly for insomnia -Switch gabapentin 100 mg 3 times daily to twice daily for anxiety and neuropathic pain   Nicotine Use Disorder -Plan to discharge with Chantix for tobacco cessation -NRT with nicotine patch   Cannabis Use Disorder -Encourage cessation which may be possible if anxiety and pain are better controlled     3. 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 Last BM 2 days ago -Outpatient GI follow-up and gastric emptying study -Continue erythromycin 250 mg 3 times daily -Start Linzess 145 mcg daily before breakfast -Start  scheduled MiraLAX plus Senokot  Malnutrition -Start Ensure supplementation   Hypertension Normotensive at this time -Continue formulary alternative for Micardis HCT (Avapro 300 + HCTZ 12.5)   Pericardial Effusion Patient reports to be mild -Outpatient follow-up   Hypercholesterolemia 10 year ASCVD risk 16.8% (intermediate) -Will discuss starting a low intensity statin tomorrow     PRN -Tylenol 650 mg q6hr PRN for mild pain -Mylanta 30 ml suspension for indigestion -Milk of Magnesia 30 ml for constipation -Trazodone 50 mg qhs for insomnia -Hydroxyzine 25 mg tid PRN for anxiety   4. Discharge Planning Patient will require the following based on my assessment:  Greatly appreciate CSW and Case management assistance with facilitating these needs and any further recommendations regarding patient's needs upon discharge. Estimated LOS: 5-7 days Discharge Concerns: Need to establish a safety plan; Medication compliance and effectiveness Discharge Goals: Return home with outpatient referrals for mental health follow-up including medication management/psychotherapy     Desiree Linsey, MD 07/06/2022, 1:53 PM

## 2022-07-06 NOTE — Progress Notes (Signed)
D:  Patient denied SI and HI, contracts for safety.  Denied A/V hallucinations.  Denied pain. A:  Medications administered per MD orders.  Emotional support and encouragement given patient. R:  Safety maintained with 15 minute checks.  

## 2022-07-07 ENCOUNTER — Ambulatory Visit: Payer: Medicare HMO | Admitting: Adult Health

## 2022-07-07 DIAGNOSIS — F3181 Bipolar II disorder: Secondary | ICD-10-CM | POA: Diagnosis not present

## 2022-07-07 DIAGNOSIS — F4310 Post-traumatic stress disorder, unspecified: Secondary | ICD-10-CM

## 2022-07-07 MED ORDER — HYDROXYZINE HCL 50 MG PO TABS: 50.0000 mg | ORAL_TABLET | Freq: Every day | ORAL | 0 refills | Status: AC

## 2022-07-07 MED ORDER — SENNOSIDES-DOCUSATE SODIUM 8.6-50 MG PO TABS: 1.0000 | ORAL_TABLET | Freq: Every day | ORAL | 0 refills | Status: AC

## 2022-07-07 MED ORDER — GABAPENTIN 100 MG PO CAPS
100.0000 mg | ORAL_CAPSULE | Freq: Two times a day (BID) | ORAL | 0 refills | Status: AC
Start: 2022-07-07 — End: 2023-06-29

## 2022-07-07 MED ORDER — LINACLOTIDE 145 MCG PO CAPS: 145.0000 ug | ORAL_CAPSULE | Freq: Every day | ORAL | 0 refills | Status: DC

## 2022-07-07 MED ORDER — NICOTINE 14 MG/24HR TD PT24
14.0000 mg | MEDICATED_PATCH | Freq: Every day | TRANSDERMAL | 0 refills | Status: AC
Start: 2022-07-08 — End: 2022-08-05

## 2022-07-07 MED ORDER — IRBESARTAN 300 MG PO TABS
300.0000 mg | ORAL_TABLET | Freq: Every day | ORAL | 0 refills | Status: DC
Start: 2022-07-08 — End: 2023-06-29

## 2022-07-07 MED ORDER — ARIPIPRAZOLE 15 MG PO TABS
15.0000 mg | ORAL_TABLET | Freq: Every day | ORAL | 0 refills | Status: AC
Start: 2022-07-07 — End: 2023-06-29

## 2022-07-07 MED ORDER — DULOXETINE HCL 30 MG PO CPEP: 30.0000 mg | ORAL_CAPSULE | Freq: Every day | ORAL | 0 refills | Status: AC

## 2022-07-07 MED ORDER — HYDROCHLOROTHIAZIDE 12.5 MG PO TABS
12.5000 mg | ORAL_TABLET | Freq: Every day | ORAL | 0 refills | Status: AC
Start: 2022-07-08 — End: 2023-06-29

## 2022-07-07 MED ORDER — OMEPRAZOLE 40 MG PO CPDR
40.0000 mg | DELAYED_RELEASE_CAPSULE | Freq: Every day | ORAL | 0 refills | Status: AC
Start: 2022-07-08 — End: 2023-06-29

## 2022-07-07 MED ORDER — ATORVASTATIN CALCIUM 20 MG PO TABS: 20.0000 mg | ORAL_TABLET | Freq: Every day | ORAL | 0 refills | Status: AC

## 2022-07-07 MED ORDER — TRAZODONE HCL 50 MG PO TABS
50.0000 mg | ORAL_TABLET | Freq: Every day | ORAL | 0 refills | Status: AC
Start: 2022-07-07 — End: 2022-08-06

## 2022-07-07 MED ORDER — POLYETHYLENE GLYCOL 3350 17 G PO PACK
17.0000 g | PACK | Freq: Every day | ORAL | 0 refills | Status: AC
Start: 2022-07-08 — End: 2022-08-07
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
## 2022-07-07 NOTE — Care Management Important Message (Signed)
Important Message  Patient Details  Name: Desiree Torres MRN: [id number] Date of Birth: Mar 10, 1956   Medicare Important Message Given:  Yes  Patient informed of right to appeal discharge, provided phone number to [facility]. Patient expressed no interest in appealing discharge at this time. CSW will continue to monitor situation.   [name], LCSWA 07/07/2022, 9:39 AM

## 2022-07-07 NOTE — Group Note (Signed)
Date:  07/07/2022 Time:  11:03 AM  Group Topic/Focus:  Orientation:   The focus of this group is to educate the patient on the purpose and policies of crisis stabilization and provide a format to answer questions about their admission.  The group details unit policies and expectations of patients while admitted.    Participation Level:  Active  Participation Quality:  Appropriate  Affect:  Appropriate  Cognitive:  Alert  Insight: Appropriate  Engagement in Group:  Engaged  Modes of Intervention:  Discussion  Additional Comments:     Reymundo Poll 07/07/2022, 11:03 AM

## 2022-07-07 NOTE — [facility] Suicide Risk Assessment (Cosign Needed Addendum)
[facility] Discharge Suicide Risk Assessment   Principal Problem: Bipolar 2 disorder [facility]) Discharge Diagnoses: Principal Problem:   Bipolar 2 disorder (HCC) Active Problems:   Cigarette nicotine dependence without complication   GAD (generalized anxiety disorder)   PTSD (post-traumatic stress disorder)  Brystal Kildow is a 66 y.o. female with type II bipolar disorder, GAD, hypertension, GERD, constipation who presented to [facility] voluntarily due to SI and racing thoughts secondary to psychotropic medication noncompliance.   HOSPITAL COURSE:  During the patient's hospitalization, patient had extensive initial psychiatric evaluation, and follow-up psychiatric evaluations every day.  Psychiatric diagnoses provided upon initial assessment:  Type II bipolar disorder PTSD GAD  Patient's psychiatric medications were adjusted on admission:  Increase home Abilify from 10 mg daily to 15 mg daily for mood stabilization Continue home Cymbalta at 60 mg daily Switch Buspar 5 mg TID to gabapentin 100 mg TID  During the hospitalization, other adjustments were made to the patient's psychiatric medication regimen:  Cymbalta was decreased from 60 mg daily to 30 mg daily  Patient's care was discussed during the interdisciplinary team meeting every day during the hospitalization.  The patient denied having side effects to prescribed psychiatric medication.  Gradually, patient started adjusting to milieu. The patient was evaluated each day by a clinical provider to ascertain response to treatment. Improvement was noted by the patient's report of decreasing symptoms, improved sleep and appetite, affect, medication tolerance, behavior, and participation in unit programming.  Patient was asked each day to complete a self inventory noting mood, mental status, pain, new symptoms, anxiety and concerns.    Symptoms were reported as significantly decreased or resolved completely by discharge.   On day of discharge,  the patient reports that their mood is stable. The patient denied having suicidal thoughts for more than 48 hours prior to discharge.  Patient denies having homicidal thoughts.  Patient denies having auditory hallucinations.  Patient denies any visual hallucinations or other symptoms of psychosis. The patient was motivated to continue taking medication with a goal of continued improvement in mental health.   The patient reports their target psychiatric symptoms of depression/irritability and racing thoughts responded well to the psychiatric medications, and the patient reports overall benefit other psychiatric hospitalization. Supportive psychotherapy was provided to the patient. The patient also participated in regular group therapy while hospitalized. Coping skills, problem solving as well as relaxation therapies were also part of the unit programming.  Labs were reviewed with the patient, and abnormal results were discussed with the patient.  The patient is able to verbalize their individual safety plan to this provider.  # It is recommended to the patient to continue psychiatric medications as prescribed, after discharge from the hospital.    # It is recommended to the patient to follow up with your outpatient psychiatric provider and PCP.  # It was discussed with the patient, the impact of alcohol, drugs, tobacco have been there overall psychiatric and medical wellbeing, and total abstinence from substance use was recommended the patient.ed.  # Prescriptions provided or sent directly to preferred pharmacy at discharge. Patient agreeable to plan. Given opportunity to ask questions. Appears to feel comfortable with discharge.    # In the event of worsening symptoms, the patient is instructed to call the crisis hotline, 911 and or go to the nearest ED for appropriate evaluation and treatment of symptoms. To follow-up with primary care provider for other medical issues, concerns and or health care  needs  # Patient was discharged home with  a plan to follow up as noted below.    Musculoskeletal: Strength & Muscle Tone: within normal limits Gait & Station: normal   Psychiatric Specialty Exam:   Presentation  General Appearance: Appropriate for Environment; Casual     Eye Contact:Good     Speech:Clear and Coherent; Normal Rate     Speech Volume:Normal     Handedness:Right       Mood and Affect  Mood: "good"     Affect: congruent, euthymic, calm       Thought Process  Thought Processes:Coherent; Goal Directed; Linear     Descriptions of Associations:Circumstantial     Orientation:Full (Time, Place and Person)     Thought Content:Logical; WDL     History of Schizophrenia/Schizoaffective disorder:No     Duration of Psychotic Symptoms:No data recorded   Hallucinations:None   Ideas of Reference:None     Suicidal Thoughts:None          Homicidal Thoughts:None     Sensorium  Memory:Immediate Good; Recent Good; Remote Good     Judgment:Fair     Insight:Fair       Executive Functions  Concentration:Good     Attention Span:Good     Recall:Good     Fund of Knowledge:Good     Language:Good       Psychomotor Activity  Psychomotor Activity:No data recorded     Assets  Assets:Communication Skills; Desire for Improvement; Financial Resources/Insurance; Housing; Leisure Time; Physical Health; Resilience; Social Support; Talents/Skills; Vocational/Educational       Sleep  Sleep:No data recorded  Physical Exam Constitutional:      Appearance: the patient is not toxic-appearing.  Pulmonary:     Effort: Pulmonary effort is normal.  Neurological:     General: No focal deficit present.     Mental Status: the patient is alert and oriented to person, place, and time.   Review of Systems  Respiratory:  Negative for shortness of breath.   Cardiovascular:  Negative for chest pain.  Gastrointestinal:  Negative for abdominal  pain, constipation, diarrhea, nausea and vomiting.  Neurological:  Negative for headaches.   Blood pressure 131/85, pulse 77, temperature 98.7 F (37.1 C), temperature source Oral, resp. rate 16, height 5\' 5"  (1.651 m), weight 62 kg, SpO2 100 %. Body mass index is 22.73 kg/m.  Mental Status Per Nursing Assessment::   On Admission:  Suicidal ideation indicated by patient  Demographic Factors:  Age 20 or older and Living alone  Loss Factors: NA  Historical Factors: NA  Risk Reduction Factors:   NA  Continued Clinical Symptoms:  Depression (resolved)  Cognitive Features That Contribute To Risk:  None    Suicide Risk:  Mild: The patient has denied suicidal ideation since admission.  She has demonstrated good behavioral and emotional regulation since being on the unit.  She has responded well to her medication changes and her target symptoms have been significantly improved.  Today, she is future oriented and committed to her treatment plan.   Follow-up Information     [facility] Go on 07/16/2022.   Specialty: Behavioral Health Why: You have an appointment for therapy services on 07/16/22 at 1:00 pm.  You also have an appointment for medication management services on 07/20/22 at 11:20 am. Contact information: [address] [place] [phone]                 Plan Of Care/Follow-up recommendations:  Activity: as tolerated  Diet: heart healthy  Other: -  Follow-up with your outpatient psychiatric provider -instructions on appointment date, time, and address (location) are provided to you in discharge paperwork.  -Take your psychiatric medications as prescribed at discharge - instructions are provided to you in the discharge paperwork  -Follow-up with outpatient primary care doctor and other specialists -for management of chronic medical disease, including: hyperlipidemia, hypertension, smoking, jaw pain,  constipation.  -Testing: Follow-up with outpatient provider for abnormal lab results:  -LDL 107  -Recommend abstinence from alcohol, tobacco, and other illicit drug use at discharge.   -If your psychiatric symptoms recur, worsen, or if you have side effects to your psychiatric medications, call your outpatient psychiatric provider, 911, [phone] or go to the nearest emergency department.   Carlyn Reichert, MD 07/07/2022, 11:39 AM

## 2022-07-07 NOTE — Progress Notes (Signed)
  [facility] Adult Case Management Discharge Plan :  Will you be returning to the same living situation after discharge:  Yes,  Patient to return to place of residence.  At discharge, do you have transportation home?: Yes,  Patient's sister to provide transportation from hospital Do you have the ability to pay for your medications: Yes,  [employer] Medicare  Patient informed of right to appeal discharge, provided phone number to [facility]. Patient expressed no interest in appealing discharge at this time. CSW will continue to monitor situation.  Release of information consent forms completed and in the chart;  Patient's signature needed at discharge.  Patient to Follow up at:  Follow-up Information     [facility]. Go on 07/16/2022.   Specialty: Behavioral Health Why: You have an appointment for therapy services on 07/16/22 at 1:00 pm.  You also have an appointment for medication management services on 07/20/22 at 11:20 am. Contact information: [address] [place] [address] [phone]                 Next level of care provider has access to [facility] Link:yes  Safety Planning and Suicide Prevention discussed: Yes,  SPE completed with patient and Candelaria Stagers, son, [phone]   Patient's support agrees to monitor patient for safety, ensure treatment compliance, and alert emergency services should patient require such services. CSW completed SPE with patient. Discussed potential triggers leading to suicidal ideation in addition to coping skills one might use in order to delay and distract self from self harming behaviors. CSW encouraged patient to utilize emergency services if they felt unable to maintain their safety. SPE flyer provided to patient at this time.      Has patient been referred to the Quitline?: Patient refused referral Tobacco Use: High Risk (07/04/2022)   Patient History    Smoking Tobacco Use: Every Day    Smokeless Tobacco Use: Never     Passive Exposure: Not on file    Patient has been referred for addiction treatment: Pt. refused referral Patient endorses active cannabis use, declined substance use treatment.  Social History   Substance and Sexual Activity  Drug Use Yes   Types: Marijuana   Social History   Substance and Sexual Activity  Alcohol Use Yes   Comment: reports occasional drink in social context    Desiree Torres, Desiree Torres 07/07/2022, 9:34 AM

## 2022-07-07 NOTE — Progress Notes (Signed)
Patient ID: Desiree Torres, female   DOB: 04/13/1956, 66 y.o.   MRN: [id number]   Pt ambulatory, alert, and oriented X4 on and off the unit. Education, support, and encouragement provided. Discharge summary/AVS, prescriptions, medications, and follow up appointments reviewed with pt and a copy of the AVS was given to pt. Medications "next dose" was also reviewed with pt and marked/notated on pt's med list on AVS. Pt refused to complete her Suicide Safety Plan. Pt was provided with suicide prevention resources. Pt's belongings in locker #44 returned and belongings sheet signed. Pt's eye drops from her medication drawer was also returned to her. Pt denies SI/HI, AVH, pain, or any concerns at this time. Pt discharged to her sister in lobby to be transported to her destination.

## 2022-07-16 ENCOUNTER — Ambulatory Visit (INDEPENDENT_AMBULATORY_CARE_PROVIDER_SITE_OTHER): Payer: Medicare HMO | Admitting: Psychiatry

## 2022-07-16 DIAGNOSIS — F3181 Bipolar II disorder: Secondary | ICD-10-CM

## 2022-07-16 NOTE — Progress Notes (Signed)
[facility] Counselor/Therapist Progress Note  Patient ID: Desiree Torres, MRN: [id number],    Date: 07/16/2022  Time Spent: 55 minutes  Treatment Type: Individual Therapy  Reported Symptoms: anxiety and depression (both improving)  Mental Status Exam:  Appearance:   Neat     Behavior:  Appropriate, Sharing, and Motivated  Motor:  Normal  Speech/Language:   Clear and Coherent  Affect:  Anxious and some depression (both improving)  Mood:  anxious  Thought process:  goal directed  Thought content:    WNL  Sensory/Perceptual disturbances:    WNL  Orientation:  oriented to person, place, time/date, situation, day of week, month of year, year, and stated date of July 16, 2022  Attention:  Good  Concentration:  Good and Fair  Memory:  WNL  Fund of knowledge:   Good  Insight:    Good  Judgment:   Good and Fair  Impulse Control:  Good and Fair   Risk Assessment: Danger to Self:  No Self-injurious Behavior: No Danger to Others: No Duty to Warn:no Physical Aggression / Violence:No  Access to Firearms a concern: No  Gang Involvement:No   Subjective: Patient in today reporting anxiety and depression, with both improving after being in hospital within past 2 weeks.  Concerned not having much relationship with her 3 adult daughters, living in 3 different states. Wonders if she'll every have a good relationship with them and processes some of her sadness, disappointment, anger, and hurt mostly related to family stressors and feeling detatched from family. Concentration a little better, sleep is better, mood is better.  Actively involved in sharing how things have been the past couple of weeks since she went in the hospital and seems to be readjusting well after coming out of hospital.  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  Also reconnecting with some of  her church friends after getting out of the hospital.  Denies any alcohol use and states that she is remaining on her medications.  Some financial stressors but seems to be managing those better.  Processed some of her family issues with her 3 adult daughters that have little contact with her, along with some family history.  No tearfulness today and feeling stronger.  Concentration is better and stress has decreased some.  Denies any SI.  Is planning a vacation trip soon and will be leaving the second week of August.  Interventions: Cognitive Behavioral Therapy   Treatment goals: Treatment goals remain on treatment plan as patient works with strategies to achieve her goals. Progress is assessed each session and documented" subject" and/or "plan" sections of treatment note. Long-term goal: Elevate mood and show evidence of usual energy, activities, and socialization. Short-term goal: Verbalize and understanding of the relationship between repressed anger and depressed mood. Strategies: Verbalize hopeful and positive statements regarding the future.  Diagnosis:   ICD-10-CM   1. Bipolar 2 disorder, major depressive episode (HCC)  F31.81      Plan: Patient today showing good motivation and active participation in session after having been hospitalized recently.  Improving in several of her symptoms especially her mood, her sleep, her concentration, her socialization, and showed no tearfulness today, reporting that she feels stronger.  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  More upbeat and very grateful to be feeling better.  Review of treatment goals with her. Denies any thoughts of harm to herself and seems genuinely more optimistic although admits "I still have issues I am needing to work on".  Denies any hopelessness.  Smiling appropriately and shared some of the contacts she  has made with her friends.  Is going to follow through on a vacation trip that she has planned in August. Encouraged patient in her practice of more positive behaviors including: Staying in the present focusing on what she can control or change, recognizing and emphasizing her strengths, getting outside daily and walking, looking for more positives versus negatives each day, remain on her prescribed medication, healthy nutrition and exercise, recommended her refraining from alcohol consumption, stop assuming worst case scenarios, positive self talk, use of daily affirmations, more intentional listening to others who are supportive of her, reduce overthinking over analyzing, allow her faith to be an emotional support as well as spiritual, letting go of things from the past that hold her back, and recognize the strength she shows working with goal-directed behaviors to move in a direction of improved emotional health.  Goal review and progress/challenges noted with patient.  Next appointment within 2 to 3 weeks.  This record has been created using [employer].  Chart creation errors have been sought, but may not always have been located and corrected.  Such creation errors do not reflect on the standard of medical care provided.   Mathis Fare, LCSW

## 2022-07-20 ENCOUNTER — Ambulatory Visit (INDEPENDENT_AMBULATORY_CARE_PROVIDER_SITE_OTHER): Payer: Self-pay | Admitting: Adult Health

## 2022-07-20 DIAGNOSIS — F489 Nonpsychotic mental disorder, unspecified: Secondary | ICD-10-CM

## 2022-07-20 NOTE — Progress Notes (Deleted)
Desiree Torres [id number] 11-Jan-1956 66 y.o.  Subjective:   Patient ID:  Desiree Torres is a 66 y.o. (DOB 11-Jan-1956) female.  Chief Complaint: No chief complaint on file.   HPI [name] presents to the office today for follow-up of GAD, MDD, insomnia and Bipolar disorder.  Describes mood today as ". Pleasant. Tearful at times. Mood symptoms - reports depression - a little - "so much going on, anxiety - "very", and irritability - increased. Stating "I have so much going on". Reports some worry and rumination. Mood is consistent. Stopped medications for a period of time and recently restarted them. 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 Varying interest and motivation. Taking medications as prescribed.  Energy levels so-so. Active, does not have a regular exercise routine.   Enjoys some usual interests and activities. Lives alone. Niece visiting and staying with her while looking for her a place to live. Separated from husband of 2 years. Has 4 grown children. [place] - family local. Spending time with family. Appetite decreased. Weight lose 144 to 142 pounds - 65". Sleeps well most nights. Averages 3 to 4 hours - mind racing - plans to start taking Trazadone at 50mg . Focus and concentration stable. Completing tasks. Managing aspects of household. 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 Works part time - 3 hours at an every day. Denies SI or HI.  Denies AH or VH. Denies recent alcohol use. Smokes tobacco and THC twice a week - has a prostetic jaw and experinecing daily pain from it.   Previous medication trials:  Welbutrin, Buspar, Abilify, Lunesta, Trazadone, Cymbalta    Flowsheet Row ED from 04/08/2022 in [facility]  C-SSRS RISK CATEGORY No Risk        Review of Systems:  Review of Systems  Medications: {medication reviewed/display:3041432}  Current  Outpatient Medications  Medication Sig Dispense Refill   ARIPiprazole (ABILIFY) 15 MG tablet Take 1 tablet (15 mg total) by mouth at bedtime. 30 tablet 0   Ascorbic Acid (VITAMIN C PO) Take 1 tablet by mouth daily.     atorvastatin (LIPITOR) 20 MG tablet Take 1 tablet (20 mg total) by mouth daily. 30 tablet 0   Cholecalciferol (VITAMIN D-3 PO) Take 1 tablet by mouth daily.     diclofenac Sodium (VOLTAREN) 1 % GEL Apply 2 g topically daily.     dorzolamide-timolol (COSOPT) 22.3-6.8 MG/ML ophthalmic solution Place 1 drop into both eyes 2 (two) times daily.     DULoxetine (CYMBALTA) 30 MG capsule Take 1 capsule (30 mg total) by mouth at bedtime. 30 capsule 0   erythromycin (ERY-TAB) 250 MG EC tablet Take 250 mg by mouth 3 (three) times daily.     gabapentin (NEURONTIN) 100 MG capsule Take 1 capsule (100 mg total) by mouth 2 (two) times daily. 60 capsule 0   hydrochlorothiazide (HYDRODIURIL) 12.5 MG tablet Take 1 tablet (12.5 mg total) by mouth daily. 30 tablet 0   hydrOXYzine (ATARAX) 50 MG tablet Take 1 tablet (50 mg total) by mouth at bedtime. 30 tablet 0   irbesartan (AVAPRO) 300 MG tablet Take 1 tablet (300 mg total) by mouth daily. 30 tablet 0   linaclotide (LINZESS) 145 MCG CAPS capsule Take 1 capsule (145 mcg total) by mouth daily before breakfast. 30 capsule 0   Multiple Vitamin (MULTIVITAMIN WITH MINERALS) TABS tablet Take 1 tablet by mouth daily.  naproxen sodium (ALEVE) 220 MG tablet Take 440 mg by mouth 2 (two) times daily as needed (For headache).     nicotine (NICODERM CQ - DOSED IN MG/24 HOURS) 14 mg/24hr patch Place 1 patch (14 mg total) onto the skin daily for 28 days. 28 patch 0   omeprazole (PRILOSEC) 40 MG capsule Take 1 capsule (40 mg total) by mouth daily. 30 capsule 0   polyethylene glycol (MIRALAX / GLYCOLAX) 17 g packet Take 17 g by mouth daily. 30 each 0   senna-docusate (SENOKOT-S) 8.6-50 MG tablet Take 1 tablet by mouth daily. 30 tablet 0   SYSTANE ULTRA 0.4-0.3 %  SOLN Place 1 drop into both eyes 2 (two) times daily.     traZODone (DESYREL) 50 MG tablet Take 1 tablet (50 mg total) by mouth at bedtime. 30 tablet 0   VITAMIN E PO Take 1 capsule by mouth daily.     No current facility-administered medications for this visit.    Medication Side Effects: {Medication Side Effects (Optional):21014029}  Allergies:  Allergies  Allergen Reactions   Pork Allergy Other (See Comments)    GI Upset (intolerance)   Ham Other (See Comments)    GI Upset   Morphine Hives and Itching    Past Medical History:  Diagnosis Date   Bipolar depression (HCC)    Hypertension     Past Medical History, Surgical history, Social history, and Family history were reviewed and updated as appropriate.   Please see review of systems for further details on the patient's review from today.   Objective:   Physical Exam:  There were no vitals taken for this visit.  Physical Exam  Lab Review:     Component Value Date/Time   NA 143 07/03/2022 0900   K 4.5 07/03/2022 0900   CL 105 07/03/2022 0900   CO2 27 07/03/2022 0900   GLUCOSE 133 (H) 07/03/2022 0900   BUN 12 07/03/2022 0900   CREATININE 0.92 07/03/2022 0900   CALCIUM 10.0 07/03/2022 0900   PROT 6.4 (L) 07/03/2022 0900   ALBUMIN 3.8 07/03/2022 0900   AST 22 07/03/2022 0900   ALT 15 07/03/2022 0900   ALKPHOS 69 07/03/2022 0900   BILITOT 0.8 07/03/2022 0900   GFRNONAA >60 07/03/2022 0900       Component Value Date/Time   WBC 5.8 07/03/2022 0900   RBC 4.47 07/03/2022 0900   HGB 13.8 07/03/2022 0900   HCT 41.1 07/03/2022 0900   PLT 288 07/03/2022 0900   MCV 91.9 07/03/2022 0900   MCH 30.9 07/03/2022 0900   MCHC 33.6 07/03/2022 0900   RDW 13.1 07/03/2022 0900   LYMPHSABS 2.6 07/03/2022 0900   MONOABS 0.4 07/03/2022 0900   EOSABS 0.1 07/03/2022 0900   BASOSABS 0.0 07/03/2022 0900    No results found for: "POCLITH", "LITHIUM"   No results found for: "PHENYTOIN", "PHENOBARB", "VALPROATE", "CBMZ"    .res Assessment: Plan:    Plan:  PDMP reviewed  Abilify 15mg  daily Gabapentin 100mg  TID Duloxetine 30 mg daily Hydroxyzine 50mg  at hs  RTC 4 weeks  Time spent with patient was 25 minutes. Greater than 50% of face to face time with patient was spent on counseling and coordination of care.    Patient advised to contact office with any questions, adverse effects, or acute worsening in signs and symptoms.   Discussed potential metabolic side effects associated with atypical antipsychotics, as well as potential risk for movement side effects. Advised pt to contact office if movement side  effects occur.   There are no diagnoses linked to this encounter.   Please see After Visit Summary for patient specific instructions.  Future Appointments  Date Time Provider Department Center  07/20/2022 11:20 AM Padme Arriaga, Thereasa Solo, NP CP-CP None  07/24/2022  9:00 AM Mathis Fare, LCSW CP[facility] None  11/29/2023  2:00 PM Tolia, Sunit, DO [facility] None    No orders of the defined types were placed in this encounter.   -------------------------------

## 2022-07-20 NOTE — Progress Notes (Signed)
Patient no show appointment. ? ?

## 2022-07-24 ENCOUNTER — Ambulatory Visit: Payer: Medicare HMO | Admitting: Psychiatry

## 2022-07-27 ENCOUNTER — Ambulatory Visit (INDEPENDENT_AMBULATORY_CARE_PROVIDER_SITE_OTHER): Payer: Medicare HMO | Admitting: Psychiatry

## 2022-07-27 DIAGNOSIS — F3181 Bipolar II disorder: Secondary | ICD-10-CM

## 2022-07-27 NOTE — Progress Notes (Signed)
[facility] Counselor/Therapist Progress Note  Patient ID: Desiree Torres, MRN: [id number],    Date: 07/27/2022  Time Spent: 50 minutes   Treatment Type: Individual Therapy  Reported Symptoms: depression, anxiety, states she's "cut back to 1 beer daily and some days I don't drink at all",  Denies any SI  Mental Status Exam:  Appearance:   Casual     Behavior:  Appropriate, Sharing, and Motivated  Motor:  Normal  Speech/Language:   Clear and Coherent  Affect:  Anxious, some depression  Mood:  anxious and depressed  Thought process:  goal directed  Thought content:    WNL  Sensory/Perceptual disturbances:    WNL  Orientation:  oriented to person, place, time/date, situation, day of week, month of year, year, and stated date of July 27, 2022  Attention:  Fair  Concentration:  Fair  Memory:  WNL  Fund of knowledge:   Good  Insight:    Good and Fair  Judgment:   Good and Fair  Impulse Control:  Good and Fair   Risk Assessment: Danger to Self:  No Self-injurious Behavior: No Danger to Others: No Duty to Warn:no Physical Aggression / Violence:No  Access to Firearms a concern: No  Gang Involvement:No   Subjective: Patient in today and reports symptoms of anxiety are the strongest, along with depression. Denies any SI. Also reports cutting back on any alcohol consumption to 1 beer daily and some days goes without drinking. Seems to feel some benefit from recent hospitalization. Feeling more stable, not as impulsive, more patience, not as irritable, and more  hopeful. Niece currently staying with her but the plan is for her to get her own space soon. Feels that a lot of her anxiety and depression is related to people in her life that are creating a lot of stress for patient and "I try not to even think about it so much and I am finding more recently that I'm able to let go of it some." Relationship with her 3 adult daughters is bad and they refuse to change per patient report.  Processed this more today her sadness over not having a healthy relationship with her daughters. Rating her sadness as an "8" on a 1-10 scale, looking at what she can change versus what she cannot change, as well as strategies that can help her when she feels sad, including not using alcohol as it is a depressant.  On a positive note, a family member is going with a group on a cruise next week and has invited patient and paid so patient is hoping this will be a good thing and does look forward to it.  Interventions: Cognitive Behavioral Therapy, Solution-Oriented/Positive Psychology, and Ego-Supportive  Treatment goals: Treatment goals remain on treatment plan as patient works with strategies to achieve her goals. Progress is assessed each session and documented" subject" and/or "plan" sections of treatment note. Long-term goal: Elevate mood and show evidence of usual energy, activities, and socialization. Short-term goal: Verbalize and understanding of the relationship between repressed anger and depressed mood. Strategies: Verbalize hopeful and positive statements regarding the future.  Diagnosis:   ICD-10-CM   1. Bipolar 2 disorder, major depressive episode (HCC)  F31.81      Plan: Patient today reporting anxiety as her main symptom along with some depression.  Feels her medication is making her too sleepy and is planning to call her her nurse that was assigned to her through her insurance plan, as she feels it  was related to med changes made when she was in the hospital.  I asked about her checking with her med provider here at our office who is currently in session with another patient.  Patient explained that this new nurse assigned to her is available to be called and wanted to go ahead and call her.  Has been more tired recently but states she is able to sleep at night.  Feels all of the stress plus uncertainty of job and her niece's dependence upon her have made things more difficult.  As  noted above, niece is expected to move out in the near future but patient did not stipulate how long that would be.  Focused primarily today on stress management and more positive self-care including getting more rest, and also positive self talk.  Denies any thoughts of self-harm.  Denies any sense of hopelessness and does feel connected to some people that are supportive of her. Encouraged patient in her practice of more positive behaviors including: Remain in the present focusing on what she can control or change versus cannot, recognize and emphasize her strengths, look for more positives versus negatives daily, get outside daily, stay on her prescribed medication, healthy nutrition and exercise, recommended her refraining from alcohol assumption, stop assuming worst case scenarios, positive self talk, use of daily affirmations, more intentional listening to others who are supportive of her, reduce overthinking and over analyzing, allow her faith to be an emotional support as well as spiritual, letting go of things from the past that hold her back, and realize the strength she shows working with goal directed behaviors to move in a direction of improved emotional health and overall wellbeing.  Goal review and progress/challenges noted with patient.  Next appointment within 2 weeks.  This record has been created using [employer].  Chart creation errors have been sought, but may not always have been located and corrected.  Such creation errors do not reflect on the standard of medical care provided.   Mathis Fare, LCSW

## 2022-08-10 ENCOUNTER — Encounter: Payer: Self-pay | Admitting: Adult Health

## 2022-08-10 ENCOUNTER — Ambulatory Visit (INDEPENDENT_AMBULATORY_CARE_PROVIDER_SITE_OTHER): Payer: Medicare HMO | Admitting: Adult Health

## 2022-08-10 ENCOUNTER — Ambulatory Visit (INDEPENDENT_AMBULATORY_CARE_PROVIDER_SITE_OTHER): Payer: Self-pay | Admitting: Adult Health

## 2022-08-10 DIAGNOSIS — F331 Major depressive disorder, recurrent, moderate: Secondary | ICD-10-CM | POA: Diagnosis not present

## 2022-08-10 DIAGNOSIS — F99 Mental disorder, not otherwise specified: Secondary | ICD-10-CM

## 2022-08-10 DIAGNOSIS — F411 Generalized anxiety disorder: Secondary | ICD-10-CM | POA: Diagnosis not present

## 2022-08-10 DIAGNOSIS — F5105 Insomnia due to other mental disorder: Secondary | ICD-10-CM | POA: Diagnosis not present

## 2022-08-10 DIAGNOSIS — F3181 Bipolar II disorder: Secondary | ICD-10-CM

## 2022-08-10 DIAGNOSIS — F489 Nonpsychotic mental disorder, unspecified: Secondary | ICD-10-CM

## 2022-08-10 NOTE — Progress Notes (Signed)
Desiree Torres [id number] [birth year] 66 y.o.  Virtual Visit via Telephone Note  I connected with pt on 08/10/22 at  3:40 PM EDT by telephone and verified that I am speaking with the correct person using two identifiers.   I discussed the limitations, risks, security and privacy concerns of performing an evaluation and management service by telephone and the availability of in person appointments. I also discussed with the patient that there may be a patient responsible charge related to this service. The patient expressed understanding and agreed to proceed.   I discussed the assessment and treatment plan with the patient. The patient was provided an opportunity to ask questions and all were answered. The patient agreed with the plan and demonstrated an understanding of the instructions.   The patient was advised to call back or seek an in-person evaluation if the symptoms worsen or if the condition fails to improve as anticipated.  I provided 25 minutes of non-face-to-face time during this encounter.  The patient was located at home.  The provider was located at [facility].   Dorothyann Gibbs, NP   Subjective:   Patient ID:  Desiree Torres is a 66 y.o. (DOB Apr 12, 1956) female.  Chief Complaint: No chief complaint on file.   HPI Desiree Torres presents for follow-up of GAD, MDD, insomnia and Bipolar disorder.  Describes mood today as "ok ". Pleasant. Tearful at times. Mood symptoms - reports decreased depression. Reports some anxiety. Reports irritability at times. Reports some worry and rumination. Mood has improved - fair. Stating "I feel a lot better". Recent hospitalization for mood decompensation. Taking medications consistently. Wanting to consider Abilify injections to maintain compliance. Varying interest and motivation. Taking medications as prescribed.  Energy levels improved. Active, does not have a regular exercise routine.   Enjoys some usual interests and  activities. Lives alone. Niece visiting and staying with her while looking for her a place to live. Separated from husband of 2 years. Has 4 grown children. [place] - family local. Spending time with family. Appetite decreased. Weight lose 144 to 142 pounds - 65". Sleeps well most nights. Averages 4 hours - mind racing. Using Trazadone as needed. Focus and concentration stable. Completing tasks. Managing aspects of household. Retired from [employer].Served in the [employer] for 15 years.  Denies SI or HI.  Denies AH or VH. Denies recent alcohol use. Smokes tobacco and THC twice a week - has a prostetic jaw and experinecing daily pain from it.   Previous medication trials:  Welbutrin, Buspar, Abilify, Lunesta, Trazadone, Cymbalta   Review of Systems:  Review of Systems  Musculoskeletal:  Negative for gait problem.  Neurological:  Negative for tremors.  Psychiatric/Behavioral:         Please refer to HPI    Medications: I have reviewed the patient's current medications.  Current Outpatient Medications  Medication Sig Dispense Refill   ARIPiprazole (ABILIFY) 15 MG tablet Take 1 tablet (15 mg total) by mouth at bedtime. 30 tablet 0   Ascorbic Acid (VITAMIN C PO) Take 1 tablet by mouth daily.     atorvastatin (LIPITOR) 20 MG tablet Take 1 tablet (20 mg total) by mouth daily. 30 tablet 0   Cholecalciferol (VITAMIN D-3 PO) Take 1 tablet by mouth daily.     diclofenac Sodium (VOLTAREN) 1 % GEL Apply 2 g topically daily.     dorzolamide-timolol (COSOPT) 22.3-6.8 MG/ML ophthalmic solution Place 1 drop into both eyes 2 (two) times daily.  DULoxetine (CYMBALTA) 30 MG capsule Take 1 capsule (30 mg total) by mouth at bedtime. 30 capsule 0   erythromycin (ERY-TAB) 250 MG EC tablet Take 250 mg by mouth 3 (three) times daily.     gabapentin (NEURONTIN) 100 MG capsule Take 1 capsule (100 mg total) by mouth 2 (two) times daily. 60 capsule 0   hydrochlorothiazide (HYDRODIURIL)  12.5 MG tablet Take 1 tablet (12.5 mg total) by mouth daily. 30 tablet 0   irbesartan (AVAPRO) 300 MG tablet Take 1 tablet (300 mg total) by mouth daily. 30 tablet 0   linaclotide (LINZESS) 145 MCG CAPS capsule Take 1 capsule (145 mcg total) by mouth daily before breakfast. 30 capsule 0   Multiple Vitamin (MULTIVITAMIN WITH MINERALS) TABS tablet Take 1 tablet by mouth daily.     naproxen sodium (ALEVE) 220 MG tablet Take 440 mg by mouth 2 (two) times daily as needed (For headache).     omeprazole (PRILOSEC) 40 MG capsule Take 1 capsule (40 mg total) by mouth daily. 30 capsule 0   SYSTANE ULTRA 0.4-0.3 % SOLN Place 1 drop into both eyes 2 (two) times daily.     traZODone (DESYREL) 50 MG tablet Take 1 tablet (50 mg total) by mouth at bedtime. 30 tablet 0   VITAMIN E PO Take 1 capsule by mouth daily.     No current facility-administered medications for this visit.    Medication Side Effects: None  Allergies:  Allergies  Allergen Reactions   Pork Allergy Other (See Comments)    GI Upset (intolerance)   Ham Other (See Comments)    GI Upset   Morphine Hives and Itching    Past Medical History:  Diagnosis Date   Bipolar depression (HCC)    Hypertension     Family History  Problem Relation Age of Onset   Heart disease Mother    Cancer Father    Heart disease Sister     Social History   Socioeconomic History   Marital status: Legally Separated    Spouse name: Not on file   Number of children: 4   Years of education: Not on file   Highest education level: Not on file  Occupational History   Not on file  Tobacco Use   Smoking status: Every Day    Packs/day: 0.25    Years: 25.00    Total pack years: 6.25    Types: Cigarettes   Smokeless tobacco: Never  Vaping Use   Vaping Use: Never used  Substance and Sexual Activity   Alcohol use: Yes    Comment: reports occasional drink in social context   Drug use: Yes    Types: Marijuana   Sexual activity: Not on file  Other  Topics Concern   Not on file  Social History Narrative   Not on file   Social Determinants of Health   Financial Resource Strain: Not on file  Food Insecurity: Not on file  Transportation Needs: Not on file  Physical Activity: Not on file  Stress: Not on file  Social Connections: Not on file  Intimate Partner Violence: Not on file    Past Medical History, Surgical history, Social history, and Family history were reviewed and updated as appropriate.   Please see review of systems for further details on the patient's review from today.   Objective:   Physical Exam:  There were no vitals taken for this visit.  Physical Exam Constitutional:      General: She is not in  acute distress. Musculoskeletal:        General: No deformity.  Neurological:     Mental Status: She is alert and oriented to person, place, and time.     Coordination: Coordination normal.  Psychiatric:        Attention and Perception: Attention and perception normal. She does not perceive auditory or visual hallucinations.        Mood and Affect: Mood normal. Mood is not anxious or depressed. Affect is not labile, blunt, angry or inappropriate.        Speech: Speech normal.        Behavior: Behavior normal.        Thought Content: Thought content normal. Thought content is not paranoid or delusional. Thought content does not include homicidal or suicidal ideation. Thought content does not include homicidal or suicidal plan.        Cognition and Memory: Cognition and memory normal.        Judgment: Judgment normal.     Comments: Insight intact     Lab Review:     Component Value Date/Time   NA 143 07/03/2022 0900   K 4.5 07/03/2022 0900   CL 105 07/03/2022 0900   CO2 27 07/03/2022 0900   GLUCOSE 133 (H) 07/03/2022 0900   BUN 12 07/03/2022 0900   CREATININE 0.92 07/03/2022 0900   CALCIUM 10.0 07/03/2022 0900   PROT 6.4 (L) 07/03/2022 0900   ALBUMIN 3.8 07/03/2022 0900   AST 22 07/03/2022 0900   ALT  15 07/03/2022 0900   ALKPHOS 69 07/03/2022 0900   BILITOT 0.8 07/03/2022 0900   GFRNONAA >60 07/03/2022 0900       Component Value Date/Time   WBC 5.8 07/03/2022 0900   RBC 4.47 07/03/2022 0900   HGB 13.8 07/03/2022 0900   HCT 41.1 07/03/2022 0900   PLT 288 07/03/2022 0900   MCV 91.9 07/03/2022 0900   MCH 30.9 07/03/2022 0900   MCHC 33.6 07/03/2022 0900   RDW 13.1 07/03/2022 0900   LYMPHSABS 2.6 07/03/2022 0900   MONOABS 0.4 07/03/2022 0900   EOSABS 0.1 07/03/2022 0900   BASOSABS 0.0 07/03/2022 0900    No results found for: "POCLITH", "LITHIUM"   No results found for: "PHENYTOIN", "PHENOBARB", "VALPROATE", "CBMZ"   .res Assessment: Plan:    Plan:  PDMP reviewed  Abilify 10 mg daily - wanting to consider an injectable. Trazodone 100 mg - taking some nights Buspar 5mg  - once daily - plans to increase Duloxetine 60 mg daily  RTC 4 weeks  Time spent with patient was 25 minutes. Greater than 50% of face to face time with patient was spent on counseling and coordination of care.    Patient advised to contact office with any questions, adverse effects, or acute worsening in signs and symptoms.   Discussed potential metabolic side effects associated with atypical antipsychotics, as well as potential risk for movement side effects. Advised pt to contact office if movement side effects occur.    Diagnoses and all orders for this visit:  Bipolar 2 disorder, major depressive episode (HCC)  GAD (generalized anxiety disorder)  Major depressive disorder, recurrent episode, moderate (HCC)  Insomnia due to other mental disorder    Please see After Visit Summary for patient specific instructions.  Future Appointments  Date Time Provider Department Center  11/29/2023  2:00 PM [place], Sunit, DO [facility] None    No orders of the defined types were placed in this encounter.     -------------------------------

## 2022-08-10 NOTE — Progress Notes (Signed)
Patient no show appointment. ? ?

## 2022-08-23 ENCOUNTER — Other Ambulatory Visit: Payer: Self-pay | Admitting: Adult Health

## 2022-08-23 ENCOUNTER — Other Ambulatory Visit: Payer: Self-pay | Admitting: Cardiology

## 2022-08-23 DIAGNOSIS — F411 Generalized anxiety disorder: Secondary | ICD-10-CM

## 2022-08-25 ENCOUNTER — Telehealth: Payer: Self-pay | Admitting: Cardiology

## 2022-08-25 ENCOUNTER — Other Ambulatory Visit: Payer: Self-pay

## 2022-08-25 MED ORDER — AMLODIPINE BESYLATE 10 MG PO TABS: 10.0000 mg | ORAL_TABLET | Freq: Every day | ORAL | 2 refills | Status: DC

## 2022-08-25 NOTE — Telephone Encounter (Signed)
Pt req refill for below meds however I'm not showing in her medication list   Amlodipine 10mg   [facility] [address]

## 2022-08-25 NOTE — Telephone Encounter (Signed)
Medication was refilled and sent to [facility] per patients request

## 2022-11-16 ENCOUNTER — Other Ambulatory Visit: Payer: Self-pay | Admitting: Cardiology

## 2022-12-26 IMAGING — CT CT ABD-PELV W/ CM
2 of 5 series · 16 of 46 positions shown, 18 images · IV contrast (agent unspecified)
Comparison: None.

CLINICAL DATA: Acute abdominal pain. History of volvulus. History
of gastric sleeve and hernia repair.

EXAM:
CT ABDOMEN AND PELVIS WITH CONTRAST
TECHNIQUE: Multidetector CT imaging of the abdomen and pelvis was performed
using the standard protocol following bolus administration of
intravenous contrast.

[Series 2: abd pel w · axial · 0.66mm/px · z∈[+742,+1117]mm · 13 of 85 slices shown, 15 images]
[im 5/85  soft-tissue]
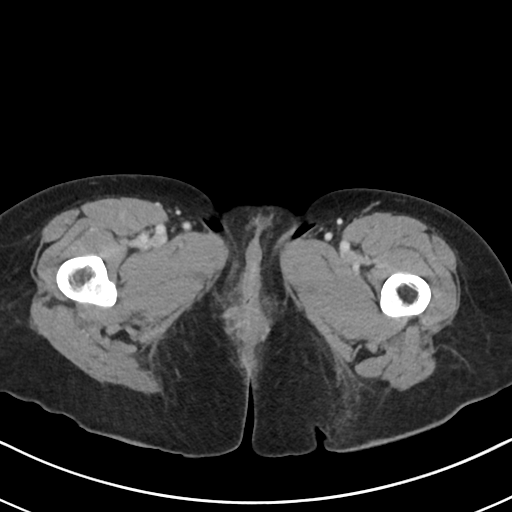
[im 5/85  bone]
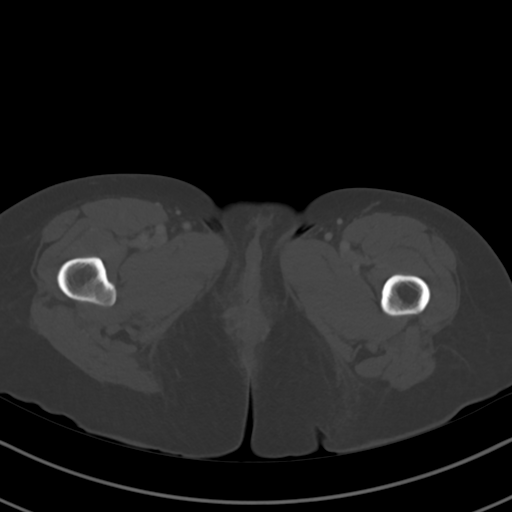
[im 14/85  soft-tissue]
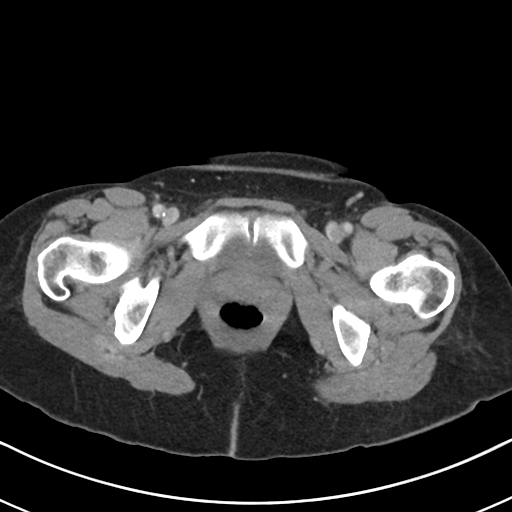
[im 18/85  soft-tissue]
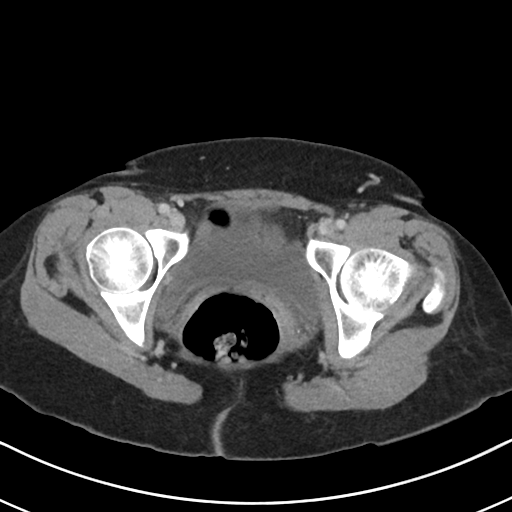
[im 23/85  soft-tissue]
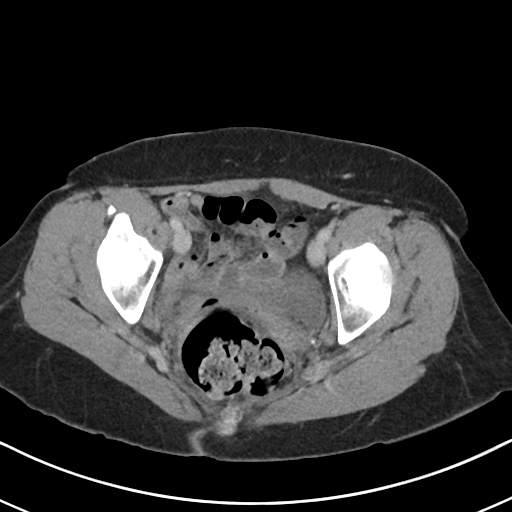
[im 31/85  soft-tissue]
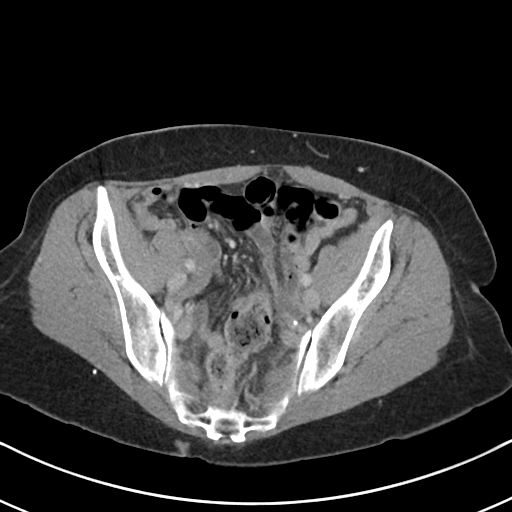
[im 36/85  soft-tissue]
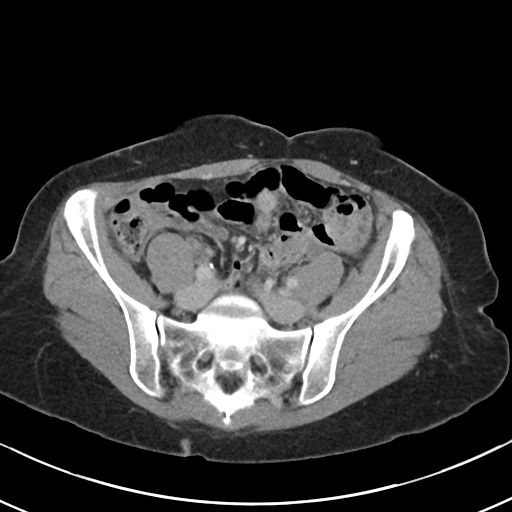
[im 45/85  soft-tissue]
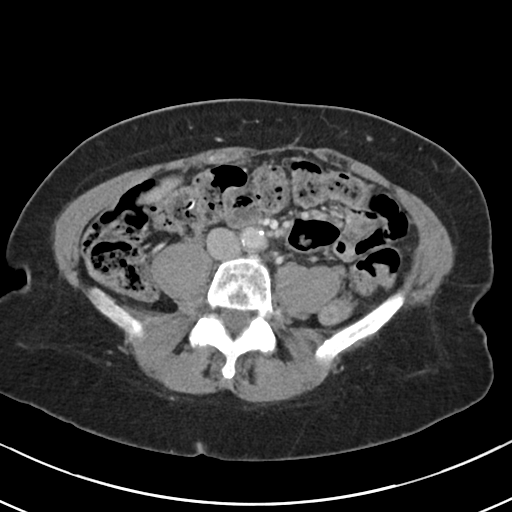
[im 49/85  soft-tissue]
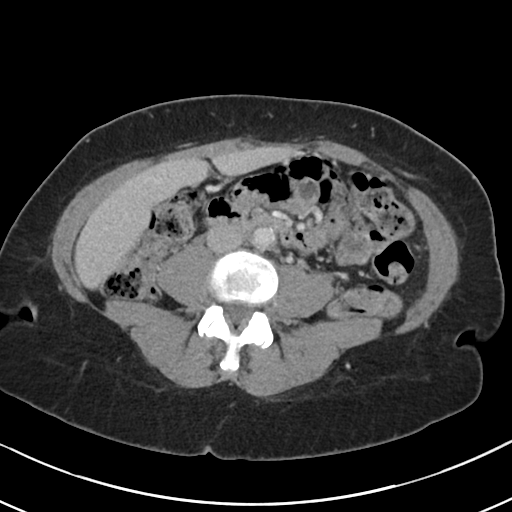
[im 54/85  soft-tissue]
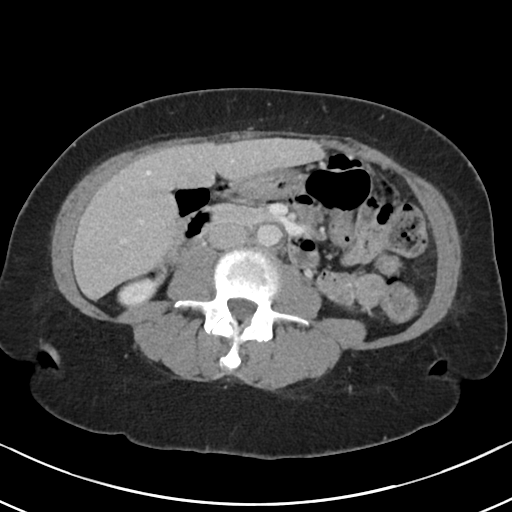
[im 54/85  bone]
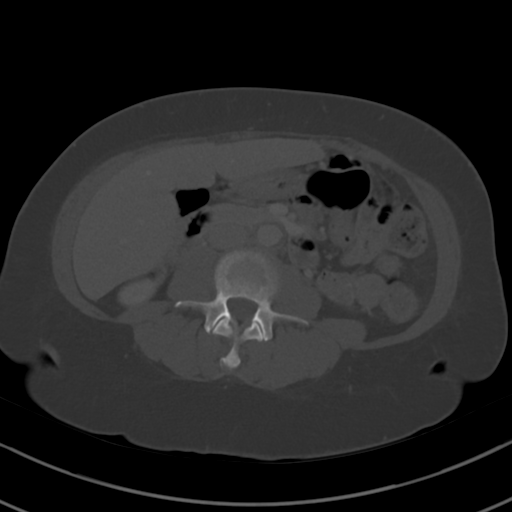
[im 62/85  soft-tissue]
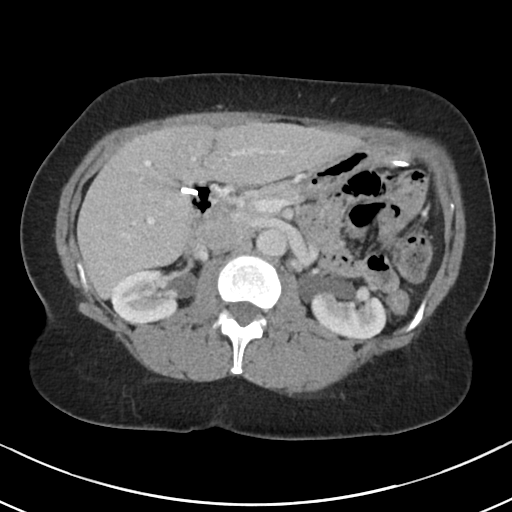
[im 67/85  soft-tissue]
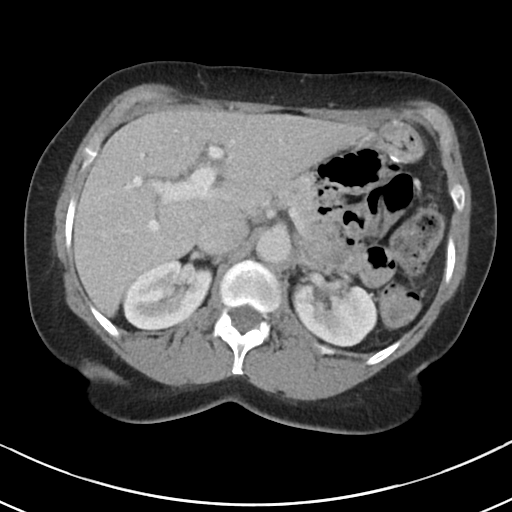
[im 71/85  soft-tissue]
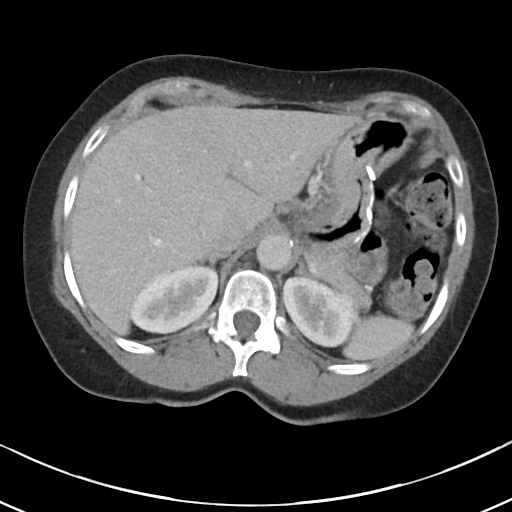
[im 80/85  soft-tissue]
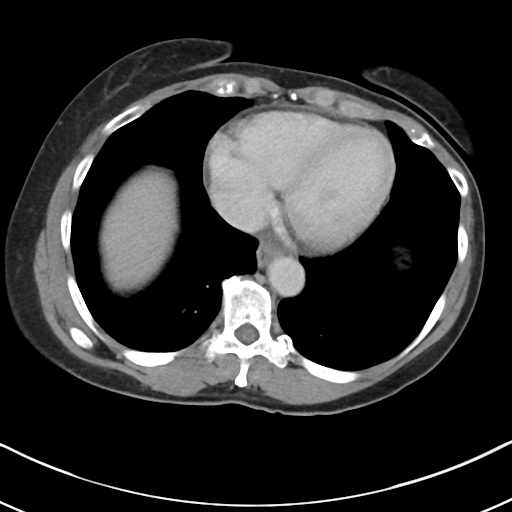

[Series 5: coronal · coronal · 0.65mm/px · 3 of 91 slices shown]
[im 31/91  soft-tissue]
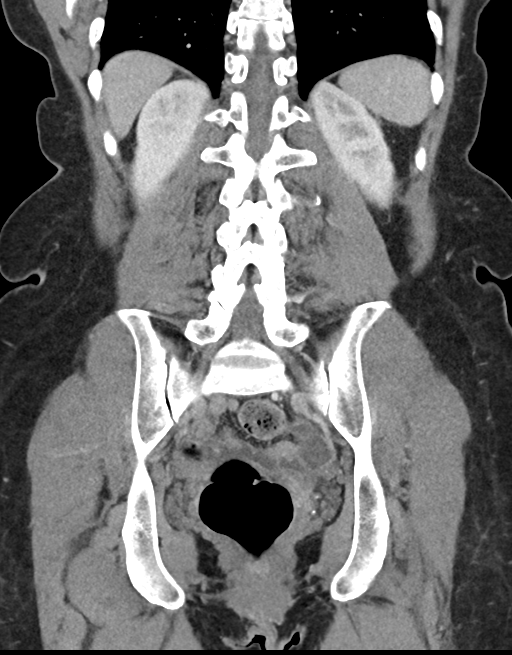
[im 41/91  soft-tissue]
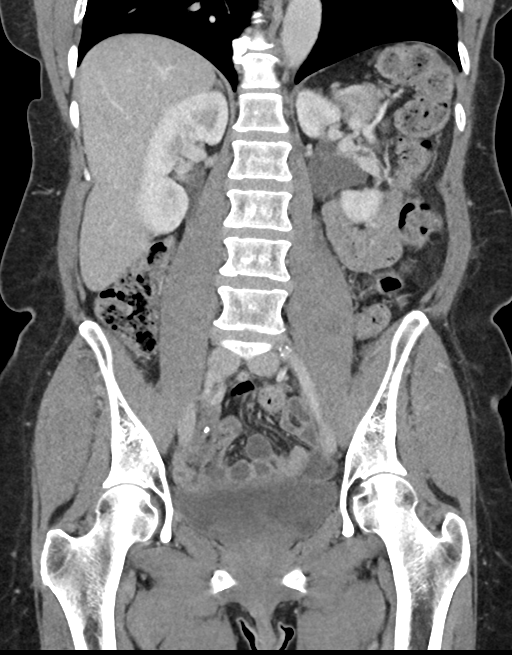
[im 51/91  soft-tissue]
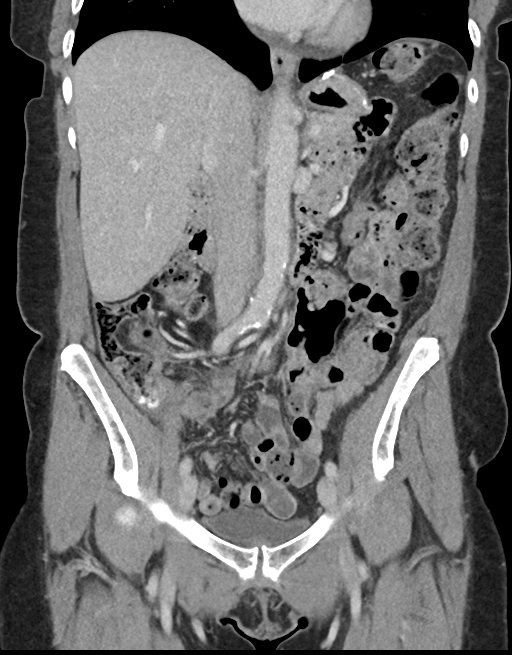

[16 of 46 positions shown; findings below may reference images not displayed]

RADIATION DOSE REDUCTION: This exam was performed according to the
departmental dose-optimization program which includes automated
exposure control, adjustment of the mA and/or kV according to
patient size and/or use of iterative reconstruction technique.

CONTRAST:  Intravenous contrast is utilized.
FINDINGS: Lower chest: No acute abnormality.

Hepatobiliary: No focal liver abnormality is seen. Status post
cholecystectomy. No biliary dilatation.

Pancreas: Unremarkable. No pancreatic ductal dilatation or
surrounding inflammatory changes.

Spleen: Normal in size without focal abnormality.

Adrenals/Urinary Tract: Adrenal glands are unremarkable. Kidneys are
normal, without renal calculi, focal lesion, or hydronephrosis.
Bladder is unremarkable.

Stomach/Bowel: Appendix appears normal. No evidence of bowel wall
thickening, distention, or inflammatory changes. There is a large
amount of stool throughout the colon. There are postsurgical changes
in the stomach and proximal small bowel. Stomach is nondilated.

Vascular/Lymphatic: Aortic atherosclerosis. No enlarged abdominal or
pelvic lymph nodes.

Reproductive: Status post hysterectomy. No adnexal masses.

Other: There is trace free fluid in the pelvis. No focal abdominal
wall hernia.

Musculoskeletal: No acute or significant osseous findings.
IMPRESSION: 1. Trace free fluid in the pelvis of uncertain etiology.
2. No other acute localizing process in the abdomen or pelvis.
3. Postsurgical changes in the stomach and proximal small bowel.
4.  Aortic Atherosclerosis ([id number]).

## 2023-01-08 ENCOUNTER — Other Ambulatory Visit: Payer: Self-pay | Admitting: Cardiology

## 2023-01-08 ENCOUNTER — Ambulatory Visit (HOSPITAL_BASED_OUTPATIENT_CLINIC_OR_DEPARTMENT_OTHER)
Admission: RE | Admit: 2023-01-08 | Discharge: 2023-01-08 | Disposition: A | Discharge status: Home / Self Care | Payer: Medicare Other | Source: Ambulatory Visit | Attending: Family Medicine | Admitting: Family Medicine

## 2023-01-08 ENCOUNTER — Other Ambulatory Visit (HOSPITAL_BASED_OUTPATIENT_CLINIC_OR_DEPARTMENT_OTHER): Payer: Self-pay | Admitting: Family Medicine

## 2023-01-08 DIAGNOSIS — M25532 Pain in left wrist: Secondary | ICD-10-CM | POA: Diagnosis present

## 2023-03-11 ENCOUNTER — Ambulatory Visit (INDEPENDENT_AMBULATORY_CARE_PROVIDER_SITE_OTHER): Payer: Medicare Other

## 2023-03-11 ENCOUNTER — Encounter: Payer: Self-pay | Admitting: Podiatry

## 2023-03-11 ENCOUNTER — Ambulatory Visit (INDEPENDENT_AMBULATORY_CARE_PROVIDER_SITE_OTHER): Payer: Medicare Other | Admitting: Podiatry

## 2023-03-11 DIAGNOSIS — I999 Unspecified disorder of circulatory system: Secondary | ICD-10-CM

## 2023-03-11 DIAGNOSIS — M2041 Other hammer toe(s) (acquired), right foot: Secondary | ICD-10-CM

## 2023-03-11 DIAGNOSIS — L84 Corns and callosities: Secondary | ICD-10-CM | POA: Diagnosis not present

## 2023-03-11 DIAGNOSIS — M2042 Other hammer toe(s) (acquired), left foot: Secondary | ICD-10-CM

## 2023-03-11 NOTE — Progress Notes (Signed)
Subjective:   Patient ID: Desiree Torres, female   DOB: 67 y.o.   MRN: [id number]   HPI Patient states she is getting constant pain in her left lesser digits and has a lesion on the third toe left fifth metatarsal right that are both painful.  Upon questioning she does get a lot of claudication symptoms and cramps in her calf muscle bilateral if she does a lot of walking and no other pathology noted   ROS      Objective:  Physical Exam  Vascular status significantly diminished diminished PT DP pulses bilateral with elevation of the lesser digits noted of a moderate nature left over right but no corns on the dorsum of the toes with a painful corn distal third toe left and on the right fifth metatarsal.     Assessment:  Strong possibility this may be vascular disease along with hammertoe deformity keratotic lesion x 2 very painful     Plan:  H&P reviewed vascular disease and I have ordered ABIs to be done within the next week and today debridement of lesions bilateral discussed hammertoes and the consideration for digital fusion which might be done at 1 point in the future.  Patient will be seen back for [place] to recheck all questions answered today concerning hammertoe surgery and also vascular disease  X-rays indicate mild elevation of the lesser digits with distal contracture digit 3 left

## 2023-03-16 ENCOUNTER — Ambulatory Visit (HOSPITAL_COMMUNITY): Payer: Medicare Other

## 2023-03-22 ENCOUNTER — Ambulatory Visit (HOSPITAL_COMMUNITY)
Admission: RE | Admit: 2023-03-22 | Discharge: 2023-03-22 | Disposition: A | Discharge status: Home / Self Care | Payer: Medicare Other | Source: Ambulatory Visit | Attending: Podiatry | Admitting: Podiatry

## 2023-03-22 DIAGNOSIS — I999 Unspecified disorder of circulatory system: Secondary | ICD-10-CM | POA: Insufficient documentation

## 2023-03-22 LAB — VAS US ABI WITH/WO TBI
Left ABI: 1.04
Right ABI: 0.93

## 2023-03-24 ENCOUNTER — Telehealth: Payer: Self-pay

## 2023-03-25 NOTE — Telephone Encounter (Signed)
This was already address in another encounter. Patient is aware of her results.

## 2023-03-25 NOTE — Progress Notes (Signed)
Please call and let her know her results were normal. No indication of vascular disease

## 2023-03-31 ENCOUNTER — Ambulatory Visit (INDEPENDENT_AMBULATORY_CARE_PROVIDER_SITE_OTHER): Payer: Medicare Other | Admitting: Podiatry

## 2023-03-31 ENCOUNTER — Encounter: Payer: Self-pay | Admitting: Podiatry

## 2023-03-31 DIAGNOSIS — M2042 Other hammer toe(s) (acquired), left foot: Secondary | ICD-10-CM | POA: Diagnosis not present

## 2023-03-31 DIAGNOSIS — L84 Corns and callosities: Secondary | ICD-10-CM

## 2023-03-31 DIAGNOSIS — M2041 Other hammer toe(s) (acquired), right foot: Secondary | ICD-10-CM | POA: Diagnosis not present

## 2023-03-31 NOTE — Progress Notes (Signed)
Subjective:   Patient ID: Desiree Torres, female   DOB: 67 y.o.   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  Patient had multiple questions concerning that stating that it is tender with ambulation and did have vascular studies done   ROS      Objective:  Physical Exam  Neurovascular status unchanged good digital perfusion noted with vascular studies showing digital challenges but good flow into the foot with patient still smoking approximate third a pack of cigarettes per day.  Patient has a digital deformity third left with chronic distal keratotic lesion that unfortunately came back very rapidly     Assessment:  Hammertoe deformity with overlapping vascular disease that is more micro versus in the macro system     Plan:  H&P reviewed and due to the intensity of discomfort and minimal response with conservative treatment I do think surgical intervention may be necessary.  Today I reviewed debrided again and I want to see how this does and I debrided other lesions discussed distal arthroplasty digit third left and patient will be seen back in 4 weeks and I did explain distal vascular disease and that I want her as best as possible stop smoking and she will continue to work on this and that there will be risk and arthroplasty but I do think she would heal and hopefully would solve the painful problem that she has

## 2023-04-02 ENCOUNTER — Other Ambulatory Visit: Payer: Self-pay | Admitting: Cardiology

## 2023-04-02 DIAGNOSIS — F17210 Nicotine dependence, cigarettes, uncomplicated: Secondary | ICD-10-CM

## 2023-04-02 DIAGNOSIS — R634 Abnormal weight loss: Secondary | ICD-10-CM | POA: Diagnosis not present

## 2023-04-09 ENCOUNTER — Other Ambulatory Visit (HOSPITAL_BASED_OUTPATIENT_CLINIC_OR_DEPARTMENT_OTHER): Payer: Self-pay | Admitting: Family Medicine

## 2023-04-09 DIAGNOSIS — R7989 Other specified abnormal findings of blood chemistry: Secondary | ICD-10-CM

## 2023-04-09 DIAGNOSIS — F17219 Nicotine dependence, cigarettes, with unspecified nicotine-induced disorders: Secondary | ICD-10-CM

## 2023-04-12 DIAGNOSIS — E538 Deficiency of other specified B group vitamins: Secondary | ICD-10-CM | POA: Diagnosis not present

## 2023-04-13 ENCOUNTER — Other Ambulatory Visit: Payer: Self-pay | Admitting: Family Medicine

## 2023-04-13 ENCOUNTER — Ambulatory Visit
Admission: RE | Admit: 2023-04-13 | Discharge: 2023-04-13 | Disposition: A | Discharge status: Home / Self Care | Payer: Medicare Other | Source: Ambulatory Visit | Attending: Family Medicine | Admitting: Family Medicine

## 2023-04-13 DIAGNOSIS — Z1231 Encounter for screening mammogram for malignant neoplasm of breast: Secondary | ICD-10-CM

## 2023-04-13 DIAGNOSIS — G8929 Other chronic pain: Secondary | ICD-10-CM | POA: Diagnosis not present

## 2023-04-13 DIAGNOSIS — R6884 Jaw pain: Secondary | ICD-10-CM | POA: Diagnosis not present

## 2023-04-19 ENCOUNTER — Ambulatory Visit (HOSPITAL_BASED_OUTPATIENT_CLINIC_OR_DEPARTMENT_OTHER)
Admission: RE | Admit: 2023-04-19 | Discharge: 2023-04-19 | Disposition: A | Discharge status: Home / Self Care | Payer: Medicare Other | Source: Ambulatory Visit | Attending: Family Medicine | Admitting: Family Medicine

## 2023-04-19 DIAGNOSIS — F17219 Nicotine dependence, cigarettes, with unspecified nicotine-induced disorders: Secondary | ICD-10-CM

## 2023-04-19 DIAGNOSIS — R7989 Other specified abnormal findings of blood chemistry: Secondary | ICD-10-CM

## 2023-04-19 DIAGNOSIS — F17210 Nicotine dependence, cigarettes, uncomplicated: Secondary | ICD-10-CM | POA: Diagnosis not present

## 2023-04-19 DIAGNOSIS — E079 Disorder of thyroid, unspecified: Secondary | ICD-10-CM | POA: Diagnosis not present

## 2023-04-30 DIAGNOSIS — R051 Acute cough: Secondary | ICD-10-CM | POA: Diagnosis not present

## 2023-05-10 DIAGNOSIS — Z79899 Other long term (current) drug therapy: Secondary | ICD-10-CM | POA: Diagnosis not present

## 2023-05-20 DIAGNOSIS — E538 Deficiency of other specified B group vitamins: Secondary | ICD-10-CM | POA: Diagnosis not present

## 2023-06-03 DIAGNOSIS — M1909 Primary osteoarthritis, other specified site: Secondary | ICD-10-CM | POA: Diagnosis not present

## 2023-06-03 DIAGNOSIS — Z0000 Encounter for general adult medical examination without abnormal findings: Secondary | ICD-10-CM | POA: Diagnosis not present

## 2023-06-03 DIAGNOSIS — I10 Essential (primary) hypertension: Secondary | ICD-10-CM | POA: Diagnosis not present

## 2023-06-03 DIAGNOSIS — K5900 Constipation, unspecified: Secondary | ICD-10-CM | POA: Diagnosis not present

## 2023-06-03 DIAGNOSIS — K219 Gastro-esophageal reflux disease without esophagitis: Secondary | ICD-10-CM | POA: Diagnosis not present

## 2023-06-10 ENCOUNTER — Encounter (HOSPITAL_BASED_OUTPATIENT_CLINIC_OR_DEPARTMENT_OTHER): Payer: Self-pay | Admitting: Emergency Medicine

## 2023-06-10 ENCOUNTER — Emergency Department (HOSPITAL_BASED_OUTPATIENT_CLINIC_OR_DEPARTMENT_OTHER): Payer: Medicare Other

## 2023-06-10 ENCOUNTER — Emergency Department (HOSPITAL_BASED_OUTPATIENT_CLINIC_OR_DEPARTMENT_OTHER)
Admission: EM | Admit: 2023-06-10 | Discharge: 2023-06-10 | Disposition: A | Discharge status: Home / Self Care | Payer: Medicare Other | Attending: Emergency Medicine | Admitting: Emergency Medicine

## 2023-06-10 ENCOUNTER — Other Ambulatory Visit: Payer: Self-pay

## 2023-06-10 DIAGNOSIS — I10 Essential (primary) hypertension: Secondary | ICD-10-CM | POA: Diagnosis not present

## 2023-06-10 DIAGNOSIS — R519 Headache, unspecified: Secondary | ICD-10-CM | POA: Diagnosis not present

## 2023-06-10 DIAGNOSIS — F17210 Nicotine dependence, cigarettes, uncomplicated: Secondary | ICD-10-CM | POA: Diagnosis not present

## 2023-06-10 DIAGNOSIS — R0789 Other chest pain: Secondary | ICD-10-CM | POA: Insufficient documentation

## 2023-06-10 DIAGNOSIS — R0602 Shortness of breath: Secondary | ICD-10-CM | POA: Diagnosis not present

## 2023-06-10 DIAGNOSIS — R079 Chest pain, unspecified: Secondary | ICD-10-CM

## 2023-06-10 DIAGNOSIS — Z79899 Other long term (current) drug therapy: Secondary | ICD-10-CM | POA: Insufficient documentation

## 2023-06-10 DIAGNOSIS — Z7982 Long term (current) use of aspirin: Secondary | ICD-10-CM | POA: Diagnosis not present

## 2023-06-10 DIAGNOSIS — E871 Hypo-osmolality and hyponatremia: Secondary | ICD-10-CM | POA: Insufficient documentation

## 2023-06-10 LAB — TROPONIN I (HIGH SENSITIVITY)
Troponin I (High Sensitivity): <2 ng/L (ref <18)
Troponin I (High Sensitivity): <2 ng/L (ref <18)

## 2023-06-10 LAB — COMPREHENSIVE METABOLIC PANEL
ALT: 17 U/L (ref 0–44)
AST: 21 U/L (ref 15–41)
Albumin: 3.8 g/dL (ref 3.5–5.0)
Alkaline Phosphatase: 80 U/L (ref 38–126)
Anion gap: 7 (ref 5–15)
BUN: 16 mg/dL (ref 8–23)
CO2: 24 mmol/L (ref 22–32)
Calcium: 9 mg/dL (ref 8.9–10.3)
Chloride: 102 mmol/L (ref 98–111)
Creatinine, Ser: 0.89 mg/dL (ref 0.44–1.00)
GFR, Estimated: >60 mL/min (ref >60)
Glucose, Bld: 79 mg/dL (ref 70–99)
Potassium: 3.9 mmol/L (ref 3.5–5.1)
Sodium: 133 mmol/L — ABNORMAL LOW (ref 135–145)
Total Bilirubin: 0.5 mg/dL (ref 0.3–1.2)
Total Protein: 7.2 g/dL (ref 6.5–8.1)

## 2023-06-10 LAB — CBC WITH DIFFERENTIAL/PLATELET
Abs Immature Granulocytes: 0.01 10*3/uL (ref 0.00–0.07)
Basophils Absolute: 0 10*3/uL (ref 0.0–0.1)
Basophils Relative: 0 %
Eosinophils Absolute: 0.1 10*3/uL (ref 0.0–0.5)
Eosinophils Relative: 2 %
HCT: 38.9 % (ref 36.0–46.0)
Hemoglobin: 12.9 g/dL (ref 12.0–15.0)
Immature Granulocytes: 0 %
Lymphocytes Relative: 40 %
Lymphs Abs: 2.8 10*3/uL (ref 0.7–4.0)
MCH: 31.2 pg (ref 26.0–34.0)
MCHC: 33.2 g/dL (ref 30.0–36.0)
MCV: 94.2 fL (ref 80.0–100.0)
Monocytes Absolute: 0.6 10*3/uL (ref 0.1–1.0)
Monocytes Relative: 9 %
Neutro Abs: 3.4 10*3/uL (ref 1.7–7.7)
Neutrophils Relative %: 49 %
Platelets: 236 10*3/uL (ref 150–400)
RBC: 4.13 MIL/uL (ref 3.87–5.11)
RDW: 12.8 % (ref 11.5–15.5)
WBC: 7 10*3/uL (ref 4.0–10.5)
nRBC: 0 % (ref 0.0–0.2)

## 2023-06-10 LAB — BRAIN NATRIURETIC PEPTIDE: B Natriuretic Peptide: 28.4 pg/mL (ref 0.0–100.0)

## 2023-06-10 MED ORDER — IOHEXOL 350 MG/ML SOLN
100.0000 mL | Freq: Once | INTRAVENOUS | Status: AC | PRN
  Administered 2023-06-10: 75 mL via INTRAVENOUS

## 2023-06-10 MED ORDER — NITROGLYCERIN 0.4 MG SL SUBL: 0.4000 mg | SUBLINGUAL_TABLET | SUBLINGUAL | Status: DC | PRN

## 2023-06-10 MED ORDER — DIPHENHYDRAMINE HCL 50 MG/ML IJ SOLN
25.0000 mg | Freq: Once | INTRAMUSCULAR | Status: AC
  Administered 2023-06-10: 25 mg via INTRAVENOUS
  Filled 2023-06-10: qty 1

## 2023-06-10 MED ORDER — ASPIRIN 81 MG PO CHEW: 324.0000 mg | CHEWABLE_TABLET | Freq: Every day | ORAL | 0 refills | Status: AC

## 2023-06-10 MED ORDER — ASPIRIN 81 MG PO CHEW
324.0000 mg | CHEWABLE_TABLET | Freq: Once | ORAL | Status: AC
  Administered 2023-06-10: 324 mg via ORAL
  Filled 2023-06-10: qty 4

## 2023-06-10 MED ORDER — PROCHLORPERAZINE EDISYLATE 10 MG/2ML IJ SOLN
10.0000 mg | Freq: Once | INTRAMUSCULAR | Status: AC
  Administered 2023-06-10: 10 mg via INTRAVENOUS
  Filled 2023-06-10: qty 2

## 2023-06-10 MED ORDER — ONDANSETRON HCL 4 MG/2ML IJ SOLN
4.0000 mg | Freq: Once | INTRAMUSCULAR | Status: AC
  Administered 2023-06-10: 4 mg via INTRAVENOUS
  Filled 2023-06-10: qty 2

## 2023-06-10 NOTE — ED Triage Notes (Signed)
Pt reports chest pain x 2 days. Pt reports that today the pain has been constant and she developed [facility] while at work.

## 2023-06-10 NOTE — ED Notes (Signed)
Patient transported to CT 

## 2023-06-10 NOTE — ED Provider Notes (Signed)
[place] [facility] Provider Note   CSN: [id number] Arrival date & time: 06/10/23  1022     History  Chief Complaint  Patient presents with   Chest Pain    Desiree Torres is a 67 y.o. female.  HPI      67 year old female with a history of hypertension, bipolar 2, gastric sleeve, GERD, who presents with concern for chest pain.   Reports for the past few days she has had some intermittent chest tightness.  It comes and goes and lasts about 10 seconds.  She reports is not associated with anything, not worse with deep breaths, exertion, position.  It just comes and goes spontaneously, even if she is just laying down.  Today, the pain became more consistent and began rating to the back.  Describes it as a tightness in her chest and in her back.  She has associated shortness of breath that also began today, as well as associated nausea and a few episodes of vomiting this morning.  Denies any significant abdominal pain.  Denies any fever, cough, leg pain or swelling.  She has a history of prior pericardial effusion or "fluid around the heart" for which she was seen in [place], and followed up with Dr. Billy Coast here in [facility], at which point a small pericardial effusion was noted on her echo without other acute findings.  She  She denies any recent surgeries or history of blood clots, however notes that she just returned from a drive back and forth to [place].   Reports that family has 3 of heart disease, with her mother having a heart attack at the age of 61.  No other known early family history.  She smokes cigarettes, but denies any other significant alcohol or drug use  Past Medical History:  Diagnosis Date   Bipolar depression (HCC)    Hypertension      Home Medications Prior to Admission medications   Medication Sig Start Date End Date Taking? Authorizing Provider  aspirin 81 MG chewable tablet Chew 4 tablets (324 mg total) by  mouth daily. 06/10/23 07/10/23 Yes Alvira Monday, MD  busPIRone (BUSPAR) 5 MG tablet Take 5 mg by mouth 3 (three) times daily. 06/09/23  Yes [provider]  amLODipine (NORVASC) 10 MG tablet Take 1 tablet by mouth once daily 08/25/22   Tolia, Sunit, DO  amLODipine (NORVASC) 10 MG tablet Take 1 tablet by mouth once daily 01/08/23   Tolia, Sunit, DO  ARIPiprazole (ABILIFY) 15 MG tablet Take 1 tablet (15 mg total) by mouth at bedtime. 07/07/22 08/06/22  Massengill, Harrold Donath, MD  Ascorbic Acid (VITAMIN C PO) Take 1 tablet by mouth daily.    [provider]  atorvastatin (LIPITOR) 20 MG tablet Take 1 tablet (20 mg total) by mouth daily. 07/08/22 08/07/22  Massengill, Harrold Donath, MD  Cholecalciferol (VITAMIN D-3 PO) Take 1 tablet by mouth daily.    [provider]  diclofenac Sodium (VOLTAREN) 1 % GEL Apply 2 g topically daily. 05/03/22   [provider]  dorzolamide-timolol (COSOPT) 22.3-6.8 MG/ML ophthalmic solution Place 1 drop into both eyes 2 (two) times daily. 06/24/22   [provider]  DULoxetine (CYMBALTA) 30 MG capsule Take 1 capsule (30 mg total) by mouth at bedtime. 07/07/22 08/06/22  Massengill, Harrold Donath, MD  erythromycin (ERY-TAB) 250 MG EC tablet Take 250 mg by mouth 3 (three) times daily. 05/21/22   [provider]  gabapentin (NEURONTIN) 100 MG capsule Take 1 capsule (  100 mg total) by mouth 2 (two) times daily. 07/07/22 08/06/22  Massengill, Harrold Donath, MD  hydrochlorothiazide (HYDRODIURIL) 12.5 MG tablet Take 1 tablet (12.5 mg total) by mouth daily. 07/08/22 08/07/22  Massengill, Harrold Donath, MD  irbesartan (AVAPRO) 300 MG tablet Take 1 tablet (300 mg total) by mouth daily. 07/08/22 08/07/22  Massengill, Harrold Donath, MD  linaclotide Karl[facility]) 145 MCG CAPS capsule Take 1 capsule (145 mcg total) by mouth daily before breakfast. 07/08/22 08/07/22  Massengill, Harrold Donath, MD  Multiple Vitamin (MULTIVITAMIN WITH MINERALS) TABS tablet Take 1 tablet by mouth daily.    [provider]  naproxen sodium (ALEVE) 220 MG tablet Take 440 mg by mouth 2 (two) times daily as needed (For headache).    [provider]  omeprazole (PRILOSEC) 40 MG capsule Take 1 capsule (40 mg total) by mouth daily. 07/08/22 08/07/22  Massengill, Harrold Donath, MD  SYSTANE ULTRA 0.4-0.3 % SOLN Place 1 drop into both eyes 2 (two) times daily. 06/24/22   [provider]  telmisartan-hydrochlorothiazide (MICARDIS HCT) 80-12.5 MG tablet Take 1 tablet by mouth daily.    [provider]  traZODone (DESYREL) 50 MG tablet Take 1 tablet (50 mg total) by mouth at bedtime. 07/07/22 08/06/22  Massengill, Harrold Donath, MD  VITAMIN E PO Take 1 capsule by mouth daily.    [provider]      Allergies    Pork allergy, Ham, and Morphine    Review of Systems   Review of Systems  Physical Exam Updated Vital Signs BP (!) 149/74   Pulse (!) 50   Temp 98.1 F (36.7 C) (Oral)   Resp 17   SpO2 100%  Physical Exam Vitals and nursing note reviewed.  Constitutional:      General: She is not in acute distress.    Appearance: She is well-developed. She is not diaphoretic.  HENT:     Head: Normocephalic and atraumatic.  Eyes:     Conjunctiva/sclera: Conjunctivae normal.  Cardiovascular:     Rate and Rhythm: Normal rate and regular rhythm.     Heart sounds: Normal heart sounds. No murmur heard.    No friction rub. No gallop.  Pulmonary:     Effort: Pulmonary effort is normal. No respiratory distress.     Breath sounds: Normal breath sounds. No wheezing or rales.  Abdominal:     General: There is no distension.     Palpations: Abdomen is soft.     Tenderness: There is no abdominal tenderness. There is no guarding.  Musculoskeletal:        General: No tenderness.     Cervical back: Normal range of motion.  Skin:    General: Skin is warm and dry.     Findings: No erythema or rash.  Neurological:     Mental Status: She is alert and oriented to person, place, and time.      ED Results / Procedures / Treatments   Labs (all labs ordered are listed, but only abnormal results are displayed) Labs Reviewed  COMPREHENSIVE METABOLIC PANEL - Abnormal; Notable for the following components:      Result Value   Sodium 133 (*)    All other components within normal limits  CBC WITH DIFFERENTIAL/PLATELET  BRAIN NATRIURETIC PEPTIDE  TROPONIN I (HIGH SENSITIVITY)  TROPONIN I (HIGH SENSITIVITY)    EKG EKG Interpretation  Date/Time:  Thursday June 10 2023 10:31:56 EDT Ventricular Rate:  58 PR Interval:  122 QRS Duration: 76 QT Interval:  409 QTC Calculation: 402 R  Axis:   66 Text Interpretation: Sinus rhythm Probable anteroseptal infarct, old No previous ECGs available Confirmed by Alvira Monday ([id number]) on 06/10/2023 11:21:39 AM  Radiology CT Angio Chest PE W and/or Wo Contrast  Result Date: 06/10/2023 CLINICAL DATA:  Pulmonary embolism (PE) suspected, high prob. Intermittent chest pain. EXAM: CT ANGIOGRAPHY CHEST WITH CONTRAST TECHNIQUE: Multidetector CT imaging of the chest was performed using the standard protocol during bolus administration of intravenous contrast. Multiplanar CT image reconstructions and MIPs were obtained to evaluate the vascular anatomy. RADIATION DOSE REDUCTION: This exam was performed according to the departmental dose-optimization program which includes automated exposure control, adjustment of the mA and/or kV according to patient size and/or use of iterative reconstruction technique. CONTRAST:  75mL OMNIPAQUE IOHEXOL 350 MG/ML SOLN COMPARISON:  Chest CT 04/19/2023. FINDINGS: Cardiovascular: Satisfactory opacification of the pulmonary arteries to the segmental level. No evidence of pulmonary embolism. Normal heart size. No pericardial effusion. Coronary artery and aortic atherosclerosis. Mediastinum/Nodes: No enlarged mediastinal, hilar, or axillary lymph nodes. Thyroid gland, trachea, and esophagus demonstrate no significant findings.  Lungs/Pleura: No consolidation or pulmonary edema. No pleural effusion or pneumothorax. Upper Abdomen: No acute abnormality. Musculoskeletal: No chest wall abnormality. No acute or significant osseous findings. Review of the MIP images confirms the above findings. IMPRESSION: 1. No evidence of pulmonary embolism. 2. Coronary artery disease. Aortic Atherosclerosis (ICD10-I70.0). Electronically Signed   By: Orvan Falconer M.D.   On: 06/10/2023 13:53   CT Head Wo Contrast  Result Date: 06/10/2023 CLINICAL DATA:  Headache, new onset (Age >= 51y) EXAM: CT HEAD WITHOUT CONTRAST TECHNIQUE: Contiguous axial images were obtained from the base of the skull through the vertex without intravenous contrast. RADIATION DOSE REDUCTION: This exam was performed according to the departmental dose-optimization program which includes automated exposure control, adjustment of the mA and/or kV according to patient size and/or use of iterative reconstruction technique. COMPARISON:  None Available. FINDINGS: Brain: No evidence of acute infarction, hemorrhage, hydrocephalus, extra-axial collection or mass lesion/mass effect. Vascular: No hyperdense vessel or unexpected calcification. Skull: Negative for fracture or focal lesion. Postsurgical changes from prior TMJ arthroplasty. Sinuses/Orbits: No middle ear or mastoid effusion. Paranasal sinuses are clear. Orbits are unremarkable. Other: None. IMPRESSION: No acute intracranial abnormality. No specific etiology for headaches identified. Electronically Signed   By: Lorenza Cambridge M.D.   On: 06/10/2023 13:49   DG Chest 2 View  Result Date: 06/10/2023 CLINICAL DATA:  Chest pain. EXAM: CHEST - 2 VIEW COMPARISON:  None Available. FINDINGS: Clear lungs. Normal heart size and mediastinal contours. No pleural effusion or pneumothorax. Visualized bones and upper abdomen are unremarkable. IMPRESSION: No evidence of acute cardiopulmonary disease. Electronically Signed   By: Orvan Falconer M.D.    On: 06/10/2023 11:36    Procedures Procedures    Medications Ordered in ED Medications  ondansetron (ZOFRAN) injection 4 mg (4 mg Intravenous Given 06/10/23 1318)  iohexol (OMNIPAQUE) 350 MG/ML injection 100 mL (75 mLs Intravenous Contrast Given 06/10/23 1319)  aspirin chewable tablet 324 mg (324 mg Oral Given 06/10/23 1320)  prochlorperazine (COMPAZINE) injection 10 mg (10 mg Intravenous Given 06/10/23 1319)  diphenhydrAMINE (BENADRYL) injection 25 mg (25 mg Intravenous Given 06/10/23 1319)    ED Course/ Medical Decision Making/ A&P                              67 year old female with a history of hypertension, bipolar 2, gastric sleeve, GERD, who presents with concern  for chest pain.  Differential diagnosis for chest pain includes pulmonary embolus, dissection, pneumothorax, pneumonia, ACS, myocarditis, pericarditis.    EKG was completed and personally evaluated interpreted by me shows no acute significant findings.  Chest x-ray was done and evaluated by me and radiology and showed no sign of pneumonia or pneumothorax.   Labs completed and personally about interpreted by me show mild hyponatremia, no leukocytosis, no anemia, no clinically significant electrolyte abnormalities.  BNP 28.4  Troponin negative x 2, doubt ACS.  She has normal pulses bilaterally, does not describe her symptoms as tearing or stabbing, and does describe shortness of breath, and have higher suspicion for PE than aortic dissection.  Ordered a CT PE study for further evaluation given recent long travel, history of shortness of breath and pain.  CT completed and personally evaluated interpreted by me shows no evidence of PE, does show coronary artery disease.  She also reports headache that began today, began slowly, denies numbness, weakness, change in vision, difficulty walking or talking or fever.  Given history of new headache with concern that she may also require contrasted imaging and/or anticoagulation,  ordered CT head for further evaluation as well as headache cocktail.  CT head without acute findings.  Not having exertional CP, troponin normal, do feel outpatient Cardiology evaluation appropriate with strict return precautions. Patient discharged in stable condition with understanding of reasons to return.         Final Clinical Impression(s) / ED Diagnoses Final diagnoses:  Nonspecific chest pain    Rx / DC Orders ED Discharge Orders          Ordered    aspirin 81 MG chewable tablet  Daily        06/10/23 1536              Alvira Monday, MD 06/11/23 2358

## 2023-06-17 DIAGNOSIS — E538 Deficiency of other specified B group vitamins: Secondary | ICD-10-CM | POA: Diagnosis not present

## 2023-06-23 ENCOUNTER — Other Ambulatory Visit: Payer: Self-pay | Admitting: Family Medicine

## 2023-06-23 ENCOUNTER — Encounter: Payer: Self-pay | Admitting: Family Medicine

## 2023-06-23 DIAGNOSIS — R634 Abnormal weight loss: Secondary | ICD-10-CM

## 2023-06-23 DIAGNOSIS — I2584 Coronary atherosclerosis due to calcified coronary lesion: Secondary | ICD-10-CM

## 2023-06-29 ENCOUNTER — Encounter: Payer: Self-pay | Admitting: Cardiology

## 2023-06-29 ENCOUNTER — Ambulatory Visit: Payer: Medicare Other | Admitting: Cardiology

## 2023-06-29 VITALS — BP 133/76 | HR 62 | Resp 16 | Ht 65.0 in | Wt 127.0 lb

## 2023-06-29 DIAGNOSIS — I2510 Atherosclerotic heart disease of native coronary artery without angina pectoris: Secondary | ICD-10-CM

## 2023-06-29 DIAGNOSIS — K219 Gastro-esophageal reflux disease without esophagitis: Secondary | ICD-10-CM

## 2023-06-29 DIAGNOSIS — E7800 Pure hypercholesterolemia, unspecified: Secondary | ICD-10-CM

## 2023-06-29 DIAGNOSIS — I700 Atherosclerosis of aorta: Secondary | ICD-10-CM

## 2023-06-29 DIAGNOSIS — F17210 Nicotine dependence, cigarettes, uncomplicated: Secondary | ICD-10-CM

## 2023-06-29 NOTE — Progress Notes (Signed)
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   Date: 06/29/23 Last Office Visit: 10/24/2021  Chief Complaint  Patient presents with   Hospitalization Follow-up    HPI  Desiree Torres is a 67 y.o. African-American female whose past medical history and cardiovascular risk factors include: Hypertension, hyperlipidemia, bipolar, Depression, cigarette smoking, marijuana use, postmenopausal.   Patient made an appointment to see as an urgent basis for evaluation of chest pain, she presented to the emergency room with chest pain a week ago and underwent chest CT which was negative for PE or aortic dissection.  In view of aortic atherosclerosis and coronary atherosclerosis and ongoing cardiovascular disease especially in view of hypertension, hypercholesterolemia, tobacco use disorder she was referred back to me.  Patient states that she is not feeling well.  States that she is extremely fatigued over the past 1 to 2 months.  States that she continues to have chest discomfort all the time.  There is no exacerbation or relieving factors.  She also endorses significant weight loss, difficulty swallowing and frequent diarrhea.   ALLERGIES: Allergies  Allergen Reactions   Pork Allergy Other (See Comments)    GI Upset (intolerance)   Ham Other (See Comments)    GI Upset   Morphine Hives and Itching    MEDICATION LIST PRIOR TO VISIT: Current Meds  Medication Sig   amLODipine (NORVASC) 10 MG tablet Take 1 tablet by mouth once daily   ARIPiprazole (ABILIFY) 15 MG tablet Take 1 tablet (15 mg total) by mouth at bedtime.   Ascorbic Acid (VITAMIN C PO) Take 1 tablet by mouth daily.   aspirin 81 MG chewable tablet Chew 4 tablets (324 mg total) by mouth daily.   atorvastatin (LIPITOR) 20 MG tablet Take 1 tablet (20 mg total) by mouth daily.    busPIRone (BUSPAR) 5 MG tablet Take 5 mg by mouth 3 (three) times daily.   Cholecalciferol (VITAMIN D-3 PO) Take 1 tablet by mouth daily.   diclofenac Sodium (VOLTAREN) 1 % GEL Apply 2 g topically daily.   dorzolamide-timolol (COSOPT) 22.3-6.8 MG/ML ophthalmic solution Place 1 drop into both eyes 2 (two) times daily.   DULoxetine (CYMBALTA) 30 MG capsule Take 1 capsule (30 mg total) by mouth at bedtime.   gabapentin (NEURONTIN) 100 MG capsule Take 1 capsule (100 mg total) by mouth 2 (two) times daily.   hydrochlorothiazide (HYDRODIURIL) 12.5 MG tablet Take 1 tablet (12.5 mg total) by mouth daily.   Multiple Vitamin (MULTIVITAMIN WITH MINERALS) TABS tablet Take 1 tablet by mouth daily.   naproxen sodium (ALEVE) 220 MG tablet Take 440 mg by mouth 2 (two) times daily as needed (For headache).   omeprazole (PRILOSEC) 40 MG capsule Take 1 capsule (40 mg total) by mouth daily.   SYSTANE ULTRA 0.4-0.3 % SOLN Place 1 drop into both eyes 2 (two) times daily.   telmisartan-hydrochlorothiazide (MICARDIS HCT) 80-12.5 MG tablet Take 1 tablet by mouth daily.   VITAMIN E PO Take 1 capsule by mouth daily.     PAST MEDICAL HISTORY: Past Medical History:  Diagnosis Date   Bipolar depression (HCC)    Hypertension     PAST SURGICAL HISTORY: Past Surgical History:  Procedure Laterality Date   BREAST BIOPSY Left 11/18/2021   benign    FAMILY HISTORY: The patient family history includes Cancer in her father; Heart disease  in her mother and sister.  SOCIAL HISTORY:  The patient  reports that she has been smoking cigarettes. She has a 6.25 pack-year smoking history. She has never used smokeless tobacco. She reports current alcohol use. She reports current drug use. Drug: Marijuana.  REVIEW OF SYSTEMS: Review of Systems  Constitutional: Positive for malaise/fatigue and weight loss.  Cardiovascular:  Positive for chest pain. Negative for dyspnea on exertion and leg swelling.   PHYSICAL EXAM:     06/29/2023    2:11 PM 06/10/2023    3:30 PM 06/10/2023    3:15 PM  Vitals with BMI  Height 5\' 5"     Weight 127 lbs    BMI 21.13    Systolic 133 149   Diastolic 76 74   Pulse 62  50   Physical Exam Neck:     Vascular: No carotid bruit or JVD.  Cardiovascular:     Rate and Rhythm: Normal rate and regular rhythm.     Pulses: Intact distal pulses.     Heart sounds: Normal heart sounds. No murmur heard.    No gallop.  Pulmonary:     Effort: Pulmonary effort is normal.     Breath sounds: Normal breath sounds.  Abdominal:     General: Bowel sounds are normal.     Palpations: Abdomen is soft.  Musculoskeletal:     Right lower leg: No edema.     Left lower leg: No edema.    Radiology: CT angiogram chest 06/10/2023: Cardiovascular: Satisfactory opacification of the pulmonary arteries to the segmental level. No evidence of pulmonary embolism. Normal heart size. No pericardial effusion. Coronary artery and aortic atherosclerosis. Lungs/Pleura: No consolidation or pulmonary edema. No pleural effusion or pneumothorax.  CARDIAC DATABASE:  EKG:  EKG 06/29/2023: Normal sinus rhythm at rate of 58 bpm, Q waves in V1 and V2, probably normal.  Normal EKG.  No change from 04/20/2022.Marland Kitchen  Echocardiogram: 11/05/2021:    1. Normal LV systolic function with visual EF 60-65%. Left ventricle cavity is normal in size. Normal left  ventricular wall thickness. Normal global wall motion. Normal diastolic filling pattern, normal LAP.  2. Mild tricuspid regurgitation. No evidence of pulmonary hypertension.  3. Small pericardial effusion. There is no hemodynamic significance.  4. No prior study for comparison.   Stress Testing: No results found for this or any previous visit from the past 1095 days.  ABI 03/22/2023: Normal ABI bilateral.  Mildly abnormal TBI's bilaterally.   Heart Catheterization: None  LABORATORY DATA:    Latest Ref Rng & Units 06/10/2023   10:59 AM 07/03/2022    9:00 AM 04/08/2022     5:37 PM  CBC  WBC 4.0 - 10.5 K/uL 7.0  5.8  9.6   Hemoglobin 12.0 - 15.0 g/dL 19.1  47.8  29.5   Hematocrit 36.0 - 46.0 % 38.9  41.1  38.4   Platelets 150 - 400 K/uL 236  288  249       Latest Ref Rng & Units 06/10/2023   10:59 AM 07/03/2022    9:00 AM 04/08/2022    5:37 PM  CMP  Glucose 70 - 99 mg/dL 79  621  83   BUN 8 - 23 mg/dL 16  12  22    Creatinine 0.44 - 1.00 mg/dL 3.08  6.57  8.46   Sodium 135 - 145 mmol/L 133  143  139   Potassium 3.5 - 5.1 mmol/L 3.9  4.5  3.9   Chloride 98 - 111 mmol/L  102  105  104   CO2 22 - 32 mmol/L 24  27  29    Calcium 8.9 - 10.3 mg/dL 9.0  01.0  27.2   Total Protein 6.5 - 8.1 g/dL 7.2  6.4  7.1   Total Bilirubin 0.3 - 1.2 mg/dL 0.5  0.8  0.3   Alkaline Phos 38 - 126 U/L 80  69  73   AST 15 - 41 U/L 21  22  18    ALT 0 - 44 U/L 17  15  15     Lipid Panel     Component Value Date/Time   CHOL 187 07/03/2022 0900   TRIG 89 07/03/2022 0900   HDL 62 07/03/2022 0900   CHOLHDL 3.0 07/03/2022 0900   VLDL 18 07/03/2022 0900   LDLCALC 107 (H) 07/03/2022 0900    Recent Labs    07/03/22 0900  TSH 0.838    BMP Recent Labs    07/03/22 0900 06/10/23 1059  NA 143 133*  K 4.5 3.9  CL 105 102  CO2 27 24  GLUCOSE 133* 79  BUN 12 16  CREATININE 0.92 0.89  CALCIUM 10.0 9.0  GFRNONAA >60 >60    HEMOGLOBIN A1C Lab Results  Component Value Date   HGBA1C 4.9 07/03/2022   MPG 93.93 07/03/2022    IMPRESSION:    ICD-10-CM   1. Chest pain due to GERD  K21.9 EKG 12-Lead   R07.9 PCV MYOCARDIAL PERFUSION WITH LEXISCAN    2. Aortic atherosclerosis (HCC)  I70.0 PCV MYOCARDIAL PERFUSION WITH LEXISCAN    3. Coronary artery calcification seen on CAT scan  I25.10 PCV MYOCARDIAL PERFUSION WITH LEXISCAN    4. Mild hypercholesterolemia  E78.00     5. Cigarette nicotine dependence without complication  F17.210       RECOMMENDATIONS: Desiree Torres is a 67 y.o. African-American female whose past medical history and cardiac risk factors include:  Hypertension, hyperlipidemia, bipolar, Depression, cigarette smoking, marijuana use, postmenopausal, advance age.  Seen in the emergency room on 06/10/2023 felt to be musculoskeletal and discharged home after negative troponins and no evidence of pulmonary embolism by CT angiogram of the chest.  She now presents for follow-up.   1. Chest pain,due to GERD  Patient's chest pain symptoms appear to be most consistent with GI etiology.  She has some difficulty with swallowing, also has been losing significant amount of weight, she has lost about 25 to 30 pounds in the past 1 year.  She also has frequent diarrhea.  I have recommended that she reestablish care with her GI gastroenterologist.  I will schedule her for a Lexiscan nuclear stress test.  Patient states that she is unable to exercise on the treadmill due to "neuropathy" and severe pain in her feet.  2. Coronary artery calcification seen on CAT scan She is concerned about coronary calcification noted on the CT scan.  She also has aortic atherosclerosis.  I have discussed with her regarding these has risk factors for future cardiovascular events.  She has been started on Lipitor 20 mg daily 3 weeks ago, she is also on aspirin, continue the same for now.  LDL goal <70.  I do not think she needs coronary calcium score as she has established coronary atherosclerosis and aortic atherosclerosis.  As long as she is willing to take statins and also continue to be on an ACE inhibitor or an ARB and blood pressure is well-controlled, continue primary prevention is indicated.  3. Mild hypercholesterolemia She is presently  on a statin for mild hypercholesterolemia.  She is on appropriate medical therapy.  4. Primary hypertension Blood pressure is well-controlled on present medical therapy.  5. Cigarette nicotine dependence without complication I discussed with her regarding tobacco use, in spite of weight loss, chest pain, significant GI issues, she is still  smoking.  Concerned about malignancy in view of abnormal weight loss as well.  I will personally perform the test and if I find abnormalities,  will perform further evaluation. Otherwise unless new on ongoing symptoms(patient advised to contact us), preventive  therapy is recommended. I will then see the patient on a PRN basis.   This was a 40-minute office visit encounter and review of her charts, updating her labs and coordination of care.  FINAL MEDICATION LIST END OF ENCOUNTER: No orders of the defined types were placed in this encounter.   Medications Discontinued During This Encounter  Medication Reason   amLODipine (NORVASC) 10 MG tablet    erythromycin (ERY-TAB) 250 MG EC tablet Completed Course   irbesartan (AVAPRO) 300 MG tablet No longer needed (for PRN medications)   linaclotide (LINZESS) 145 MCG CAPS capsule      Current Outpatient Medications:    amLODipine (NORVASC) 10 MG tablet, Take 1 tablet by mouth once daily, Disp: 90 tablet, Rfl: 1   ARIPiprazole (ABILIFY) 15 MG tablet, Take 1 tablet (15 mg total) by mouth at bedtime., Disp: 30 tablet, Rfl: 0   Ascorbic Acid (VITAMIN C PO), Take 1 tablet by mouth daily., Disp: , Rfl:    aspirin 81 MG chewable tablet, Chew 4 tablets (324 mg total) by mouth daily., Disp: 120 tablet, Rfl: 0   atorvastatin (LIPITOR) 20 MG tablet, Take 1 tablet (20 mg total) by mouth daily., Disp: 30 tablet, Rfl: 0   busPIRone (BUSPAR) 5 MG tablet, Take 5 mg by mouth 3 (three) times daily., Disp: , Rfl:    Cholecalciferol (VITAMIN D-3 PO), Take 1 tablet by mouth daily., Disp: , Rfl:    diclofenac Sodium (VOLTAREN) 1 % GEL, Apply 2 g topically daily., Disp: , Rfl:    dorzolamide-timolol (COSOPT) 22.3-6.8 MG/ML ophthalmic solution, Place 1 drop into both eyes 2 (two) times daily., Disp: , Rfl:    DULoxetine (CYMBALTA) 30 MG capsule, Take 1 capsule (30 mg total) by mouth at bedtime., Disp: 30 capsule, Rfl: 0   gabapentin (NEURONTIN) 100 MG capsule, Take 1  capsule (100 mg total) by mouth 2 (two) times daily., Disp: 60 capsule, Rfl: 0   hydrochlorothiazide (HYDRODIURIL) 12.5 MG tablet, Take 1 tablet (12.5 mg total) by mouth daily., Disp: 30 tablet, Rfl: 0   Multiple Vitamin (MULTIVITAMIN WITH MINERALS) TABS tablet, Take 1 tablet by mouth daily., Disp: , Rfl:    naproxen sodium (ALEVE) 220 MG tablet, Take 440 mg by mouth 2 (two) times daily as needed (For headache)., Disp: , Rfl:    omeprazole (PRILOSEC) 40 MG capsule, Take 1 capsule (40 mg total) by mouth daily., Disp: 30 capsule, Rfl: 0   SYSTANE ULTRA 0.4-0.3 % SOLN, Place 1 drop into both eyes 2 (two) times daily., Disp: , Rfl:    telmisartan-hydrochlorothiazide (MICARDIS HCT) 80-12.5 MG tablet, Take 1 tablet by mouth daily., Disp: , Rfl:    VITAMIN E PO, Take 1 capsule by mouth daily., Disp: , Rfl:    traZODone (DESYREL) 50 MG tablet, Take 1 tablet (50 mg total) by mouth at bedtime., Disp: 30 tablet, Rfl: 0  Orders Placed This Encounter  Procedures   PCV MYOCARDIAL  PERFUSION WITH LEXISCAN   EKG 12-Lead    Patient Instructions  Desiree Rama, PA-C  [address]  [address]  [place], [place] [address]  Phone: [phone]    I will be performing stress test for you.  Unless the stress test is abnormal, I do not think your chest pain is related to heart issues.  In view of difficulty swallowing, frequent diarrhea, weight loss, you have lost about 20 to 30 pounds since we last saw you a year ago, I have recommended that you follow-up with your gastroenterologist.   --Continue cardiac medications as reconciled in final medication list. --Return if symptoms worsen or fail to improve. Or sooner if needed. --Continue follow-up with your primary care physician regarding the management of your other chronic comorbid conditions.  Patient's questions and concerns were addressed to her satisfaction. She voices understanding of the instructions provided during this encounter.    Yates Decamp, MD, [facility] 06/29/2023, 2:48 PM Office: [phone] Fax: [phone] Pager: [phone]

## 2023-06-29 NOTE — Patient Instructions (Addendum)
Desiree Rama, PA-C  [address]  [address]  [place], [place] [address]  Phone: [phone]    I will be performing stress test for you.  Unless the stress test is abnormal, I do not think your chest pain is related to heart issues.  In view of difficulty swallowing, frequent diarrhea, weight loss, you have lost about 20 to 30 pounds since we last saw you a year ago, I have recommended that you follow-up with your gastroenterologist.

## 2023-07-05 ENCOUNTER — Ambulatory Visit: Payer: Medicare Other | Admitting: Cardiology

## 2023-07-12 ENCOUNTER — Ambulatory Visit: Payer: Medicare Other

## 2023-07-15 ENCOUNTER — Ambulatory Visit
Admission: RE | Admit: 2023-07-15 | Discharge: 2023-07-15 | Disposition: A | Discharge status: Home / Self Care | Payer: Medicare Other | Source: Ambulatory Visit | Attending: Family Medicine | Admitting: Family Medicine

## 2023-07-15 ENCOUNTER — Other Ambulatory Visit: Payer: Self-pay | Admitting: Family Medicine

## 2023-07-15 DIAGNOSIS — R634 Abnormal weight loss: Secondary | ICD-10-CM

## 2023-07-15 DIAGNOSIS — I2510 Atherosclerotic heart disease of native coronary artery without angina pectoris: Secondary | ICD-10-CM

## 2023-07-16 ENCOUNTER — Ambulatory Visit: Payer: Medicare Other | Admitting: Cardiology

## 2023-07-19 ENCOUNTER — Ambulatory Visit: Payer: Medicare Other

## 2023-07-20 ENCOUNTER — Encounter: Payer: Self-pay | Admitting: Cardiology

## 2023-07-20 NOTE — Progress Notes (Signed)
Normal stress test

## 2023-07-21 ENCOUNTER — Ambulatory Visit (INDEPENDENT_AMBULATORY_CARE_PROVIDER_SITE_OTHER): Payer: Medicare Other | Admitting: Psychiatry

## 2023-07-21 DIAGNOSIS — F331 Major depressive disorder, recurrent, moderate: Secondary | ICD-10-CM | POA: Diagnosis not present

## 2023-07-21 NOTE — Progress Notes (Signed)
[facility] Counselor/Therapist Progress Note  Patient ID: Desiree Torres, MRN: [id number],    Date: 07/21/2023  Time Spent: 50 minutes   Treatment Type: Individual Therapy  Reported Symptoms: depression, tearfulness, reports that she has decreased her alcohol consumption to "less than 1 beer daily".  Denies any SI.   Mental Status Exam:  Appearance:   Casual     Behavior:  Appropriate, Sharing, and Motivated  Motor:  Normal  Speech/Language:   Clear and Coherent  Affect:  Depressed and anxious  Mood:  anxious and depressed  Thought process:  goal directed  Thought content:    Rumination  Sensory/Perceptual disturbances:    WNL  Orientation:  oriented to person, place, time/date, situation, day of week, month of year, year, and stated date of July 21, 2023  Attention:  Good  Concentration:  Fair  Memory:  Some short term memory issues and Dr is aware  Fund of knowledge:   Good and Fair  Insight:    Good and Fair  Judgment:   Good  Impulse Control:  Good   Risk Assessment: Danger to Self:  No Self-injurious Behavior: No Danger to Others: No Duty to Warn:no Physical Aggression / Violence:No  Access to Firearms a concern: No  Gang Involvement:No   Subjective: Patient in for session today and reports symptoms of depression, anxiety, and health problems and family issues especially adult children. Shared "I'm not feeling good about me" and having difficulty connecting in healthy ways with others. Avoids at times and minimizes herself and her own needs. "Have been through so much and sometimes it just messes me up and people take it in a negative way." Not real interested in friendships due to so many negative ones in the past. Some days she doesn't have any contact with others and on other days she may have limited contact. Difficulty trusting others due to bad relationships in the past. Worked on this in more detail today with patient explaining how getting hurt in the past  by others, is affecting her now, "because I don't trust people, they're mean, and you cannot trust them. Processed more of her distance with others and the difficulty she has connecting and having healthy relationships, and sharing what she would like to have in terms of being connected with other people and yet "not sharing all my business".  Supported her need for privacy and not wanting to air out all of her personal business, especially with some of the things she has dealt with in the past and her personal and family life.  Interventions: Cognitive Behavioral Therapy and Ego-Supportive  Treatment goals: Treatment goals remain on treatment plan as patient works with strategies to achieve her goals. Progress is assessed each session and documented" subject" and/or "plan" sections of treatment note. Long-term goal: Elevate mood and show evidence of usual energy, activities, and socialization. Short-term goal: Verbalize and understanding of the relationship between repressed anger and depressed mood. Strategies: Verbalize hopeful and positive statements regarding the future.  Diagnosis:   ICD-10-CM   1. Major depressive disorder, recurrent episode, moderate (HCC)  F33.1      Plan: Patient in session today and we reviewed her previous treatment goals and she feels they are right on target for her and wants to continue working on them.  She had made some progress in the past and can certainly pick back up on her goal-directed behaviors to move in a more forward direction that she has been experiencing in  the more recent past since she was last seen in therapy.  Has good attitude today although tired, but is motivated to start taking better care of herself and working with her goals more intentionally. Encouraged patient more in her practice of more positive and self affirming behaviors including: Remain on her prescribed medication, stay in the present focusing on what she can control or change,  recognize and emphasize her strengths, look for more positives versus negatives daily, getting outside some each day, healthy nutrition and exercise, refrain from alcohol assumption, refrain from assuming worst-case scenarios, use of her daily affirmations which she reports has helped her in the past, more intentional listening to others who are supportive of her, reduce overthinking and over analyzing, let her faith be an emotional support as well as spiritual, letting go of things from the past that hold her back now, and recognize the strengths she shows working with goal-directed behaviors to move in a direction of improved emotional health and a healthier outlook.  Goal review and progress/challenges noted with patient.  Next appointment within 2 to 3 weeks.   Mathis Fare, LCSW

## 2023-07-21 NOTE — Progress Notes (Signed)
Called and spoke with patient regarding her stress test results.

## 2023-07-21 NOTE — Progress Notes (Signed)
Normal stress test

## 2023-07-27 ENCOUNTER — Encounter: Payer: Self-pay | Admitting: Vascular Surgery

## 2023-07-27 ENCOUNTER — Ambulatory Visit: Payer: Medicare Other | Admitting: Vascular Surgery

## 2023-07-27 VITALS — BP 124/73 | HR 75 | Temp 97.9°F | Resp 18 | Ht 65.0 in | Wt 128.9 lb

## 2023-07-27 DIAGNOSIS — G629 Polyneuropathy, unspecified: Secondary | ICD-10-CM

## 2023-07-27 NOTE — Progress Notes (Unsigned)
[facility] [place]  ASSESSMENT / PLAN: 67 y.o. female with likely idiopathic neuropathy.   CHIEF COMPLAINT: ***  HISTORY OF PRESENT ILLNESS: Desiree Torres is a 67 y.o. female ***  VASCULAR SURGICAL HISTORY: ***  VASCULAR RISK FACTORS: {FINDINGS; POSITIVE [phone]} history of stroke / transient ischemic attack. {FINDINGS; POSITIVE [phone]} history of coronary artery disease. *** history of PCI. *** history of CABG.  {FINDINGS; POSITIVE [phone]} history of diabetes mellitus. Last A1c ***. {FINDINGS; POSITIVE [phone]} history of smoking. *** actively smoking. {FINDINGS; POSITIVE [phone]} history of hypertension. *** drug regimen with *** control. {FINDINGS; POSITIVE [phone]} history of chronic kidney disease.  Last GFR ***. CKD {stage:30421363}. {FINDINGS; POSITIVE [phone]} history of chronic obstructive pulmonary disease, treated with ***.  FUNCTIONAL STATUS: ECOG performance status: {findings; ecog performance status:31780} Ambulatory status: {TNHAmbulation:25868}  CAREY 1 AND 3 YEAR INDEX Female (2pts) 75-79 or 80-84 (2pts) >84 (3pts) Dependence in toileting (1pt) Partial or full dependence in dressing (1pt) History of malignant neoplasm (2pts) CHF (3pts) COPD (1pts) CKD (3pts)  0-3 pts 6% 1 year mortality ; 21% 3 year mortality 4-5 pts 12% 1 year mortality ; 36% 3 year mortality >5 pts 21% 1 year mortality; 54% 3 year mortality   Past Medical History:  Diagnosis Date   Bipolar depression (HCC)    Hypertension     Past Surgical History:  Procedure Laterality Date   BREAST BIOPSY Left 11/18/2021   benign    Family History  Problem Relation Age of Onset   Heart disease Mother    Cancer Father    Heart disease Sister     Social History   Socioeconomic History   Marital status: Legally Separated    Spouse name: Not on file   Number of children: 4    Years of education: Not on file   Highest education level: Not on file  Occupational History   Not on file  Tobacco Use   Smoking status: Every Day    Current packs/day: 0.50    Average packs/day: 0.5 packs/day for 25.0 years (12.5 ttl pk-yrs)    Types: Cigarettes    Start date: 07/26/1998   Smokeless tobacco: Never  Vaping Use   Vaping status: Never Used  Substance and Sexual Activity   Alcohol use: Yes    Comment: reports occasional drink in social context   Drug use: Yes    Types: Marijuana   Sexual activity: Not on file  Other Topics Concern   Not on file  Social History Narrative   Not on file   Social Determinants of Health   Financial Resource Strain: Not on file  Food Insecurity: Not on file  Transportation Needs: Not on file  Physical Activity: Not on file  Stress: Not on file  Social Connections: Not on file  Intimate Partner Violence: Not on file    Allergies  Allergen Reactions   Pork Allergy Other (See Comments)    GI Upset (intolerance)   Ham Other (See Comments)    GI Upset   Morphine Hives and Itching    Current Outpatient Medications  Medication Sig Dispense Refill   amLODipine (NORVASC) 10 MG tablet Take 1 tablet by mouth once daily 90 tablet 1   ARIPiprazole (ABILIFY) 15 MG tablet Take 1 tablet (15 mg total) by mouth at bedtime. 30 tablet 0   Ascorbic Acid (VITAMIN C PO) Take 1 tablet by mouth daily.     atorvastatin (LIPITOR) 20 MG tablet Take 1 tablet (20 mg  total) by mouth daily. 30 tablet 0   busPIRone (BUSPAR) 5 MG tablet Take 5 mg by mouth 3 (three) times daily.     Cholecalciferol (VITAMIN D-3 PO) Take 1 tablet by mouth daily.     diclofenac Sodium (VOLTAREN) 1 % GEL Apply 2 g topically daily.     dorzolamide-timolol (COSOPT) 22.3-6.8 MG/ML ophthalmic solution Place 1 drop into both eyes 2 (two) times daily.     DULoxetine (CYMBALTA) 30 MG capsule Take 1 capsule (30 mg total) by mouth at bedtime. 30 capsule 0   gabapentin (NEURONTIN) 100  MG capsule Take 1 capsule (100 mg total) by mouth 2 (two) times daily. 60 capsule 0   hydrochlorothiazide (HYDRODIURIL) 12.5 MG tablet Take 1 tablet (12.5 mg total) by mouth daily. 30 tablet 0   Multiple Vitamin (MULTIVITAMIN WITH MINERALS) TABS tablet Take 1 tablet by mouth daily.     naproxen sodium (ALEVE) 220 MG tablet Take 440 mg by mouth 2 (two) times daily as needed (For headache).     omeprazole (PRILOSEC) 40 MG capsule Take 1 capsule (40 mg total) by mouth daily. 30 capsule 0   SYSTANE ULTRA 0.4-0.3 % SOLN Place 1 drop into both eyes 2 (two) times daily.     telmisartan-hydrochlorothiazide (MICARDIS HCT) 80-12.5 MG tablet Take 1 tablet by mouth daily.     traZODone (DESYREL) 50 MG tablet Take 1 tablet (50 mg total) by mouth at bedtime. 30 tablet 0   VITAMIN E PO Take 1 capsule by mouth daily.     No current facility-administered medications for this visit.    PHYSICAL EXAM Vitals:   07/27/23 0939  BP: 124/73  Pulse: 75  Resp: 18  Temp: 97.9 F (36.6 C)  TempSrc: Temporal  SpO2: 98%  Weight: 128 lb 14.4 oz (58.5 kg)  Height: 5\' 5"  (1.651 m)    Constitutional: *** appearing. *** distress. Appears *** nourished.  Neurologic: CN ***. *** focal findings. *** sensory loss. Psychiatric: *** Mood and affect symmetric and appropriate. Eyes: *** No icterus. No conjunctival pallor. Ears, nose, throat: *** mucous membranes moist. Midline trachea.  Cardiac: *** rate and rhythm.  Respiratory: *** unlabored. Abdominal: *** soft, non-tender, non-distended.  Peripheral vascular: *** Extremity: *** edema. *** cyanosis. *** pallor.  Skin: *** gangrene. *** ulceration.  Lymphatic: *** Stemmer's sign. *** palpable lymphadenopathy.    PERTINENT LABORATORY AND RADIOLOGIC DATA  Most recent CBC    Latest Ref Rng & Units 06/10/2023   10:59 AM 07/03/2022    9:00 AM 04/08/2022    5:37 PM  CBC  WBC 4.0 - 10.5 K/uL 7.0  5.8  9.6   Hemoglobin 12.0 - 15.0 g/dL 33.2  95.1  88.4   Hematocrit  36.0 - 46.0 % 38.9  41.1  38.4   Platelets 150 - 400 K/uL 236  288  249      Most recent CMP    Latest Ref Rng & Units 06/10/2023   10:59 AM 07/03/2022    9:00 AM 04/08/2022    5:37 PM  CMP  Glucose 70 - 99 mg/dL 79  166  83   BUN 8 - 23 mg/dL 16  12  22    Creatinine 0.44 - 1.00 mg/dL 0.63  0.16  0.10   Sodium 135 - 145 mmol/L 133  143  139   Potassium 3.5 - 5.1 mmol/L 3.9  4.5  3.9   Chloride 98 - 111 mmol/L 102  105  104   CO2 22 -  32 mmol/L 24  27  29    Calcium 8.9 - 10.3 mg/dL 9.0  95.6  21.3   Total Protein 6.5 - 8.1 g/dL 7.2  6.4  7.1   Total Bilirubin 0.3 - 1.2 mg/dL 0.5  0.8  0.3   Alkaline Phos 38 - 126 U/L 80  69  73   AST 15 - 41 U/L 21  22  18    ALT 0 - 44 U/L 17  15  15      Renal function CrCl cannot be calculated (Patient's most recent lab result is older than the maximum 21 days allowed.).  Hgb A1c MFr Bld (%)  Date Value  07/03/2022 4.9    LDL Cholesterol  Date Value Ref Range Status  07/03/2022 107 (H) 0 - 99 mg/dL Final    Comment:           Total Cholesterol/HDL:CHD Risk Coronary Heart Disease Risk Table                     Men   Women  1/2 Average Risk   3.4   3.3  Average Risk       5.0   4.4  2 X Average Risk   9.6   7.1  3 X Average Risk  23.4   11.0        Use the calculated Patient Ratio above and the CHD Risk Table to determine the patient's CHD Risk.        ATP III CLASSIFICATION (LDL):  <100     mg/dL   Optimal  086-578  mg/dL   Near or Above                    Optimal  130-159  mg/dL   Borderline  469-629  mg/dL   High  >528     mg/dL   Very High Performed at [facility], [address], [place], [place] [address]      Vascular Imaging: ***  Rande Brunt. Lenell Antu, MD [facility] Phone Number: [phone] 07/27/2023 12:40 PM   Total time spent on preparing this encounter including chart review, data review, collecting history, examining the patient, coordinating care for this  {tnhtimebilling:26202}  Portions of this report may have been transcribed using voice recognition software.  Every effort has been made to ensure accuracy; however, inadvertent computerized transcription errors may still be present.

## 2023-08-10 ENCOUNTER — Ambulatory Visit: Payer: Medicare Other | Admitting: Psychiatry

## 2023-08-10 DIAGNOSIS — F331 Major depressive disorder, recurrent, moderate: Secondary | ICD-10-CM

## 2023-08-10 NOTE — Progress Notes (Signed)
[facility] Counselor/Therapist Progress Note  Patient ID: Desiree Torres, MRN: [id number],    Date: 08/10/2023  Time Spent: 55 minutes   Treatment Type: Individual Therapy  Reported Symptoms: depression, anxiety, denies any SI, improving and showing more  Mental Status Exam:  Appearance:   Casual and Neat     Behavior:  Appropriate, Sharing, and Motivated  Motor:  Normal  Speech/Language:   Clear and Coherent  Affect:  Depressed, anxious  Mood:  anxious and depressed  Thought process:  goal directed  Thought content:    WNL  Sensory/Perceptual disturbances:    WNL  Orientation:  oriented to person, place, time/date, situation, day of week, month of year, year, and stated date of Aug. 20, 2024  Attention:  Good  Concentration:  Good  Memory:  WNL  Fund of knowledge:   Good  Insight:    Good  Judgment:   Good  Impulse Control:  Good   Risk Assessment: Danger to Self:  No Self-injurious Behavior: No Danger to Others: No Duty to Warn:no Physical Aggression / Violence:No  Access to Firearms a concern: No  Gang Involvement:No   Subjective: Patient in session today reporting anxiety, depression, family issues with adult children, and ongoing health concerns which she reports she is getting treatment and is now improving.  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 Encouraging patient in making good choices in relationships and in developing some healthier friendships. "I did meet another Thomasene Ripple recently and she and I have continued contact. Is still involved in her church locally and recently joined their "justice group, supporting each other through legal issues".  "I try to  stay out of my head because I can really get stuck on things and go round and round in my head." Learning to let go, especially when I don't have control over things. Working to not [employer] as much" and doing some better. Also worked further today on not letting others take advantage of her, which seemed helpful to patient and she is to continues practicing this between sessions. Has had a lot of challenges physically and emotionally and feels she is improving.  Interventions: Cognitive Behavioral Therapy and Ego-Supportive  Long-term goal: Elevate mood and show evidence of usual energy, activities, and socialization. Short-term goal: Verbalize and understanding of the relationship between repressed anger and depressed mood. Strategies: Verbalize hopeful and positive statements regarding the future.    Diagnosis:   ICD-10-CM   1. Major depressive disorder, recurrent episode, moderate (HCC)  F33.1      Plan:  Patient today continues working with goal directed behaviors and needs to continue in order to move in a forward direction. Depression is improving and her attitude is quite good. Encouraged patient in her practice of more positive and self affirming behaviors including: Remaining on prescribed medication, remain in the present focusing on what she can change or control, recognize and emphasize her strengths, looking for more positives versus negatives each day, healthy nutrition and exercise, refrain from alcohol assumption, refrain from assuming worst-case scenarios, use of her daily affirmations which she reports has helped in the past, more intentional listening to others who are supportive of her,  reduce overthinking and over analyzing, let her faith be an emotional support as well as spiritual, letting go of things from the past that hold her back now, and realize the strength she shows working with goal-directed behaviors to move in a direction of improved emotional health and  overall wellbeing.  Goal review and progress/challenges noted with patient.  Next appt within 2-3 weeks.   Mathis Fare, LCSW

## 2023-08-20 ENCOUNTER — Ambulatory Visit: Payer: Medicare Other | Admitting: Psychiatry

## 2023-08-20 DIAGNOSIS — F331 Major depressive disorder, recurrent, moderate: Secondary | ICD-10-CM | POA: Diagnosis not present

## 2023-08-20 NOTE — Progress Notes (Signed)
[facility] Counselor/Therapist Progress Note  Patient ID: Desiree Torres, MRN: [id number],    Date: 08/20/2023  Time Spent: 50 minutes   Treatment Type: Individual Therapy  Reported Symptoms: anxiety, depression decreasing, No SI   Mental Status Exam:  Appearance:   Casual and Neat     Behavior:  Appropriate, Sharing, and Motivated  Motor:  Normal  Speech/Language:   Clear and Coherent  Affect:  anxious  Mood:  anxious and some depression  Thought process:  goal directed  Thought content:    WNL  Sensory/Perceptual disturbances:    WNL  Orientation:  oriented to person, place, time/date, situation, day of week, month of year, year, and stated date of Aug. 30, 2024  Attention:  Good  Concentration:  Good and Fair  Memory:  Some short term memory issues and Dr is aware  Fund of knowledge:   Good  Insight:    Good  Judgment:   Good  Impulse Control:  Good and Fair   Risk Assessment: Danger to Self:  No Self-injurious Behavior: No Danger to Others: No Duty to Warn:no Physical Aggression / Violence:No  Access to Firearms a concern: No  Gang Involvement:No   Subjective:  Patient in session today and reports anxiety, some depression (improving), impulsivity with money, "needing to get a different job" and is following through on leads. Worked well in session today focusing on her impulsivity and wanting to work to "say no" or "delay her buying non-necessities", and also prioritizing needs versus wants. Also stressed with current job and the physical demands and some concerns about safety issues. (Not all details included in this note due to patient privacy needs.) Continues to try and balance personal/family needs and issues, some ongoing health concerns, and looking for a different job.  Seems to be feeling better about herself overall and following through on healthcare issues while also trying to connect with people that are healthy for her.  Is relieved that her physical  testing recently did not turn out to be stomach cancer.  Interacting with friends that are helpful and supportive of her and continues to try and not "over think in my head" nor assume the negatives in situations.  Setting more clear limits with people that have tended to take advantage of her in the past.  Interventions: Cognitive Behavioral Therapy and Ego-Supportive  Long-term goal: Elevate mood and show evidence of usual energy, activities, and socialization. Short-term goal: Verbalize and understanding of the relationship between repressed anger and depressed mood. Strategies: Verbalize hopeful and positive statements regarding the future.   Diagnosis:   ICD-10-CM   1. Major depressive disorder, recurrent episode, moderate (HCC)  F33.1      Plan:  Patient and for session today and continues to focus on her goal-directed behaviors and following through on recommendations and sessions related to her goals.  Trying to have a more positive attitude even in the face of challenges and already able to note some improvement in her depression.  She is progressing and needs to continue working with goal-directed behaviors to move in a hopeful, forward direction. Reminded and encouraged patient and her practice of more positive and self affirming behaviors including: Staying on prescribed medication, remain in the present focusing on what she can change her control, recognize and emphasize her strengths, look for more positives each day versus negatives, healthy nutrition and exercise, refrain from alcohol consumption, refrain from assuming worst case scenarios, use of her daily affirmations which she has  stated was very helpful in the past, more intentional listening to others who are supportive of her, reduce overthinking and over analyzing, that her faith be an emotional support as well as spiritual, letting go of things from the past that hold her back now, and recognize the strength she shows working  with goal-directed behaviors to move in the direction of improved emotional health and outlook into the future.  Goal review and progress/challenges noted with patient.  Next appointment within 2 to 3 weeks.   Mathis Fare, LCSW

## 2023-09-07 DIAGNOSIS — E538 Deficiency of other specified B group vitamins: Secondary | ICD-10-CM | POA: Diagnosis not present

## 2023-09-08 ENCOUNTER — Ambulatory Visit: Payer: Medicare Other | Admitting: Psychiatry

## 2023-09-17 DIAGNOSIS — K5730 Diverticulosis of large intestine without perforation or abscess without bleeding: Secondary | ICD-10-CM | POA: Diagnosis not present

## 2023-09-17 DIAGNOSIS — R188 Other ascites: Secondary | ICD-10-CM | POA: Diagnosis not present

## 2023-09-17 DIAGNOSIS — R1010 Upper abdominal pain, unspecified: Secondary | ICD-10-CM | POA: Diagnosis not present

## 2023-09-17 DIAGNOSIS — F17210 Nicotine dependence, cigarettes, uncomplicated: Secondary | ICD-10-CM | POA: Diagnosis not present

## 2023-09-17 DIAGNOSIS — R197 Diarrhea, unspecified: Secondary | ICD-10-CM | POA: Diagnosis not present

## 2023-09-17 DIAGNOSIS — I700 Atherosclerosis of aorta: Secondary | ICD-10-CM | POA: Diagnosis not present

## 2023-09-17 DIAGNOSIS — Z9049 Acquired absence of other specified parts of digestive tract: Secondary | ICD-10-CM | POA: Diagnosis not present

## 2023-09-18 DIAGNOSIS — E860 Dehydration: Secondary | ICD-10-CM | POA: Diagnosis not present

## 2023-09-18 DIAGNOSIS — Z9049 Acquired absence of other specified parts of digestive tract: Secondary | ICD-10-CM | POA: Diagnosis not present

## 2023-09-18 DIAGNOSIS — K529 Noninfective gastroenteritis and colitis, unspecified: Secondary | ICD-10-CM | POA: Diagnosis not present

## 2023-09-18 DIAGNOSIS — E876 Hypokalemia: Secondary | ICD-10-CM | POA: Diagnosis not present

## 2023-09-19 DIAGNOSIS — E876 Hypokalemia: Secondary | ICD-10-CM | POA: Diagnosis not present

## 2023-09-19 DIAGNOSIS — E860 Dehydration: Secondary | ICD-10-CM | POA: Diagnosis not present

## 2023-09-19 DIAGNOSIS — K529 Noninfective gastroenteritis and colitis, unspecified: Secondary | ICD-10-CM | POA: Diagnosis not present

## 2023-09-29 ENCOUNTER — Ambulatory Visit: Payer: Medicare Other | Admitting: Psychiatry

## 2023-10-06 DIAGNOSIS — E538 Deficiency of other specified B group vitamins: Secondary | ICD-10-CM | POA: Diagnosis not present

## 2023-11-03 DIAGNOSIS — G629 Polyneuropathy, unspecified: Secondary | ICD-10-CM | POA: Diagnosis not present

## 2023-11-04 DIAGNOSIS — E538 Deficiency of other specified B group vitamins: Secondary | ICD-10-CM | POA: Diagnosis not present

## 2023-11-11 DIAGNOSIS — G629 Polyneuropathy, unspecified: Secondary | ICD-10-CM | POA: Diagnosis not present

## 2023-11-29 ENCOUNTER — Ambulatory Visit: Payer: Medicare Other | Admitting: Cardiology

## 2023-12-13 DIAGNOSIS — Z720 Tobacco use: Secondary | ICD-10-CM | POA: Diagnosis not present

## 2023-12-13 DIAGNOSIS — R6884 Jaw pain: Secondary | ICD-10-CM | POA: Diagnosis not present

## 2023-12-13 DIAGNOSIS — I10 Essential (primary) hypertension: Secondary | ICD-10-CM | POA: Diagnosis not present

## 2023-12-13 DIAGNOSIS — E782 Mixed hyperlipidemia: Secondary | ICD-10-CM | POA: Diagnosis not present

## 2023-12-13 DIAGNOSIS — G629 Polyneuropathy, unspecified: Secondary | ICD-10-CM | POA: Diagnosis not present

## 2023-12-13 DIAGNOSIS — E538 Deficiency of other specified B group vitamins: Secondary | ICD-10-CM | POA: Diagnosis not present

## 2023-12-13 DIAGNOSIS — J439 Emphysema, unspecified: Secondary | ICD-10-CM | POA: Diagnosis not present

## 2023-12-13 DIAGNOSIS — R911 Solitary pulmonary nodule: Secondary | ICD-10-CM | POA: Diagnosis not present

## 2023-12-13 DIAGNOSIS — I700 Atherosclerosis of aorta: Secondary | ICD-10-CM | POA: Diagnosis not present

## 2023-12-14 DIAGNOSIS — Z7982 Long term (current) use of aspirin: Secondary | ICD-10-CM | POA: Diagnosis not present

## 2023-12-14 DIAGNOSIS — Z79899 Other long term (current) drug therapy: Secondary | ICD-10-CM | POA: Diagnosis not present

## 2023-12-14 DIAGNOSIS — I639 Cerebral infarction, unspecified: Secondary | ICD-10-CM | POA: Diagnosis not present

## 2023-12-14 DIAGNOSIS — R4781 Slurred speech: Secondary | ICD-10-CM | POA: Diagnosis not present

## 2023-12-14 DIAGNOSIS — J432 Centrilobular emphysema: Secondary | ICD-10-CM | POA: Diagnosis not present

## 2023-12-14 DIAGNOSIS — R29818 Other symptoms and signs involving the nervous system: Secondary | ICD-10-CM | POA: Diagnosis not present

## 2023-12-14 DIAGNOSIS — R2990 Unspecified symptoms and signs involving the nervous system: Secondary | ICD-10-CM | POA: Diagnosis not present

## 2023-12-14 DIAGNOSIS — R202 Paresthesia of skin: Secondary | ICD-10-CM | POA: Diagnosis not present

## 2023-12-14 DIAGNOSIS — F17210 Nicotine dependence, cigarettes, uncomplicated: Secondary | ICD-10-CM | POA: Diagnosis not present

## 2023-12-14 DIAGNOSIS — I10 Essential (primary) hypertension: Secondary | ICD-10-CM | POA: Diagnosis not present

## 2023-12-14 DIAGNOSIS — E785 Hyperlipidemia, unspecified: Secondary | ICD-10-CM | POA: Diagnosis not present

## 2023-12-14 DIAGNOSIS — G459 Transient cerebral ischemic attack, unspecified: Secondary | ICD-10-CM | POA: Diagnosis not present

## 2023-12-14 DIAGNOSIS — G629 Polyneuropathy, unspecified: Secondary | ICD-10-CM | POA: Diagnosis not present

## 2023-12-14 DIAGNOSIS — R200 Anesthesia of skin: Secondary | ICD-10-CM | POA: Diagnosis not present

## 2023-12-14 DIAGNOSIS — R531 Weakness: Secondary | ICD-10-CM | POA: Diagnosis not present

## 2023-12-14 DIAGNOSIS — R29810 Facial weakness: Secondary | ICD-10-CM | POA: Diagnosis not present

## 2023-12-14 DIAGNOSIS — I6782 Cerebral ischemia: Secondary | ICD-10-CM | POA: Diagnosis not present

## 2023-12-14 DIAGNOSIS — M79645 Pain in left finger(s): Secondary | ICD-10-CM | POA: Diagnosis not present

## 2023-12-15 DIAGNOSIS — R2990 Unspecified symptoms and signs involving the nervous system: Secondary | ICD-10-CM | POA: Diagnosis not present

## 2023-12-17 DIAGNOSIS — G459 Transient cerebral ischemic attack, unspecified: Secondary | ICD-10-CM | POA: Diagnosis not present

## 2023-12-17 DIAGNOSIS — M79642 Pain in left hand: Secondary | ICD-10-CM | POA: Diagnosis not present

## 2023-12-20 DIAGNOSIS — H02409 Unspecified ptosis of unspecified eyelid: Secondary | ICD-10-CM | POA: Diagnosis not present

## 2023-12-29 DIAGNOSIS — G459 Transient cerebral ischemic attack, unspecified: Secondary | ICD-10-CM | POA: Diagnosis not present

## 2023-12-29 DIAGNOSIS — R42 Dizziness and giddiness: Secondary | ICD-10-CM | POA: Diagnosis not present

## 2023-12-29 DIAGNOSIS — R0789 Other chest pain: Secondary | ICD-10-CM | POA: Diagnosis not present

## 2023-12-29 DIAGNOSIS — Z79899 Other long term (current) drug therapy: Secondary | ICD-10-CM | POA: Diagnosis not present

## 2023-12-29 DIAGNOSIS — R9431 Abnormal electrocardiogram [ECG] [EKG]: Secondary | ICD-10-CM | POA: Diagnosis not present

## 2023-12-29 DIAGNOSIS — R079 Chest pain, unspecified: Secondary | ICD-10-CM | POA: Diagnosis not present

## 2023-12-29 DIAGNOSIS — I639 Cerebral infarction, unspecified: Secondary | ICD-10-CM | POA: Diagnosis not present

## 2023-12-29 DIAGNOSIS — I6782 Cerebral ischemia: Secondary | ICD-10-CM | POA: Diagnosis not present

## 2023-12-29 DIAGNOSIS — F17210 Nicotine dependence, cigarettes, uncomplicated: Secondary | ICD-10-CM | POA: Diagnosis not present

## 2023-12-29 DIAGNOSIS — I10 Essential (primary) hypertension: Secondary | ICD-10-CM | POA: Diagnosis not present

## 2023-12-30 DIAGNOSIS — G459 Transient cerebral ischemic attack, unspecified: Secondary | ICD-10-CM | POA: Diagnosis not present

## 2023-12-30 DIAGNOSIS — R9431 Abnormal electrocardiogram [ECG] [EKG]: Secondary | ICD-10-CM | POA: Diagnosis not present

## 2023-12-30 DIAGNOSIS — H401131 Primary open-angle glaucoma, bilateral, mild stage: Secondary | ICD-10-CM | POA: Diagnosis not present

## 2024-01-03 DIAGNOSIS — G629 Polyneuropathy, unspecified: Secondary | ICD-10-CM | POA: Diagnosis not present

## 2024-01-03 DIAGNOSIS — G459 Transient cerebral ischemic attack, unspecified: Secondary | ICD-10-CM | POA: Diagnosis not present

## 2024-01-07 DIAGNOSIS — Z7189 Other specified counseling: Secondary | ICD-10-CM | POA: Diagnosis not present

## 2024-01-07 DIAGNOSIS — G459 Transient cerebral ischemic attack, unspecified: Secondary | ICD-10-CM | POA: Diagnosis not present

## 2024-01-07 DIAGNOSIS — Z0000 Encounter for general adult medical examination without abnormal findings: Secondary | ICD-10-CM | POA: Diagnosis not present

## 2024-01-07 DIAGNOSIS — E538 Deficiency of other specified B group vitamins: Secondary | ICD-10-CM | POA: Diagnosis not present

## 2024-01-07 DIAGNOSIS — Z1211 Encounter for screening for malignant neoplasm of colon: Secondary | ICD-10-CM | POA: Diagnosis not present

## 2024-01-18 ENCOUNTER — Ambulatory Visit: Payer: Medicare Other | Admitting: Psychology

## 2024-01-18 DIAGNOSIS — I2510 Atherosclerotic heart disease of native coronary artery without angina pectoris: Secondary | ICD-10-CM | POA: Diagnosis not present

## 2024-01-18 DIAGNOSIS — F411 Generalized anxiety disorder: Secondary | ICD-10-CM | POA: Diagnosis not present

## 2024-01-18 DIAGNOSIS — F3181 Bipolar II disorder: Secondary | ICD-10-CM | POA: Diagnosis not present

## 2024-01-18 DIAGNOSIS — G459 Transient cerebral ischemic attack, unspecified: Secondary | ICD-10-CM | POA: Diagnosis not present

## 2024-01-18 DIAGNOSIS — I10 Essential (primary) hypertension: Secondary | ICD-10-CM | POA: Diagnosis not present
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
## 2024-02-01 ENCOUNTER — Ambulatory Visit: Payer: Medicare Other | Admitting: Psychology

## 2024-02-01 DIAGNOSIS — F411 Generalized anxiety disorder: Secondary | ICD-10-CM

## 2024-02-01 DIAGNOSIS — I493 Ventricular premature depolarization: Secondary | ICD-10-CM | POA: Diagnosis not present

## 2024-02-01 DIAGNOSIS — F3181 Bipolar II disorder: Secondary | ICD-10-CM

## 2024-02-01 DIAGNOSIS — G459 Transient cerebral ischemic attack, unspecified: Secondary | ICD-10-CM | POA: Diagnosis not present

## 2024-02-01 NOTE — Progress Notes (Signed)
 [facility] Counselor/Therapist Progress Note  Patient ID: Desiree Torres, MRN: [id number]    Date: 02/01/24  Time Spent: 1:00 pm - 1:56 pm : 56 minutes  Treatment Type: Individual Therapy.  Reported Symptoms:  Feeling nervous and edge, trouble relaxing, being so restless that it is hard to sit still, becoming easily annoyed or irritable. Feeling down, sleep trouble, appetite concerns, trouble concentrating, being so fidgety that she has been moving around more than usual.   Mental Status Exam: Appearance:  Well Groomed     Behavior: Appropriate  Motor: Normal  Speech/Language:  Normal Rate  Affect: Appropriate  Mood: normal  Thought process: normal  Thought content:   WNL  Sensory/Perceptual disturbances:   WNL  Orientation: oriented to person, place, and time/date  Attention: Good  Concentration: Good  Memory: WNL  Fund of knowledge:  Good  Insight:   Good  Judgment:  Good  Impulse Control: Good   Risk Assessment: Danger to Self:  No Self-injurious Behavior: No Danger to Others: No Duty to Warn:no Physical Aggression / Violence:No  Access to Firearms a concern: No  Gang Involvement:No   Subjective:   Desiree Torres participated in the session, in person in the office with the therapist, and consented to treatment Desiree Torres reviewed the events of the past week. We reviewed numerous treatment approaches including CBT, BA, Problem Solving, and Solution focused therapy. Psych-education regarding the Desiree Torres's diagnosis of Bipolar 2, MDD, GGAD was provided during the session.   We discussed Desiree Torres goals treatment goals which include increase motivation, set goals, increase mindfulness, symptom management, engagement in cosnistent self-care. Desiree Torres provided verbal approval of the treatment plan.    Interventions: Psycho-education & Goal Setting.   Diagnosis:   Bipolar 2 disorder, MDD, GAD  Psychiatric Treatment: Yes , [facility],  Psychiatry  Treatment Plan:  Client Abilities/Strengths Desiree Torres is engaged in sessions, motivated for change, committed to her current relationship.  Support System: Significant other Joe, and son, Dorene Sorrow.  Client Treatment Preferences OPT  Client Statement of Needs Desiree Torres would like to increase her motivation, det goals, increase mindfulness, symptom management, engage in consistent self-care.    Treatment Level Weekly/Biweekly  Symptoms  Anxiety:  Feeling nervous and edge, trouble relaxing, being so restless that it is hard to sit still, becoming easily annoyed or irritable.   (Status: maintained) Depression:  Feeling down, sleep trouble, appetite concerns, trouble concentrating, being so fidgety that she's been moving around more than usual (Status: maintained)  Goals:   Desiree Torres experiences symptoms of Bipolar 2 MDD, and GAD  Treatment plan signed and available on s-drive:  Yes    Target Date: 01/17/2025 Frequency: Weekly/Biweekly  Progress: 0 Modality: individual    Therapist will provide referrals for additional resources as appropriate.  Therapist will provide psycho-education regarding Desiree Torres's diagnosis and corresponding treatment approaches and interventions. Desiree Torres will support the patient's ability to achieve the goals identified. will employ CBT, BA, Problem-solving, Solution Focused, Mindfulness,  coping skills, & other evidenced-based practices will be used to promote progress towards healthy functioning to help manage decrease symptoms associated with their diagnosis.   Reduce overall level, frequency, and intensity of the feelings of depression, anxiety and panic evidenced by decreased overall symptoms from 6 to 7 days/week to 0 to 1 days/week per client report for at least 3 consecutive months. Verbally express understanding of the relationship between feelings of depression and anxiety and their impact on thinking patterns and behaviors. Verbalize an  understanding of the  role that distorted thinking plays in creating fears, excessive worry, and ruminations.  Desiree Torres participated in the creation of the treatment plan)    Desiree Torres

## 2024-02-04 DIAGNOSIS — E538 Deficiency of other specified B group vitamins: Secondary | ICD-10-CM | POA: Diagnosis not present

## 2024-02-04 DIAGNOSIS — J209 Acute bronchitis, unspecified: Secondary | ICD-10-CM | POA: Diagnosis not present

## 2024-02-11 DIAGNOSIS — Z7185 Encounter for immunization safety counseling: Secondary | ICD-10-CM | POA: Diagnosis not present

## 2024-02-11 DIAGNOSIS — J439 Emphysema, unspecified: Secondary | ICD-10-CM | POA: Diagnosis not present

## 2024-02-11 DIAGNOSIS — I10 Essential (primary) hypertension: Secondary | ICD-10-CM | POA: Diagnosis not present

## 2024-02-11 DIAGNOSIS — G459 Transient cerebral ischemic attack, unspecified: Secondary | ICD-10-CM | POA: Diagnosis not present

## 2024-02-11 DIAGNOSIS — Z13820 Encounter for screening for osteoporosis: Secondary | ICD-10-CM | POA: Diagnosis not present

## 2024-02-13 DIAGNOSIS — I6782 Cerebral ischemia: Secondary | ICD-10-CM | POA: Diagnosis not present

## 2024-02-13 DIAGNOSIS — G43109 Migraine with aura, not intractable, without status migrainosus: Secondary | ICD-10-CM | POA: Diagnosis not present

## 2024-02-13 DIAGNOSIS — R29818 Other symptoms and signs involving the nervous system: Secondary | ICD-10-CM | POA: Diagnosis not present

## 2024-02-13 DIAGNOSIS — R4701 Aphasia: Secondary | ICD-10-CM | POA: Diagnosis not present

## 2024-02-13 DIAGNOSIS — Z903 Acquired absence of stomach [part of]: Secondary | ICD-10-CM | POA: Diagnosis not present

## 2024-02-13 DIAGNOSIS — Z79899 Other long term (current) drug therapy: Secondary | ICD-10-CM | POA: Diagnosis not present

## 2024-02-13 DIAGNOSIS — I639 Cerebral infarction, unspecified: Secondary | ICD-10-CM | POA: Diagnosis not present

## 2024-02-13 DIAGNOSIS — I6521 Occlusion and stenosis of right carotid artery: Secondary | ICD-10-CM | POA: Diagnosis not present

## 2024-02-13 DIAGNOSIS — R001 Bradycardia, unspecified: Secondary | ICD-10-CM | POA: Diagnosis not present

## 2024-02-13 DIAGNOSIS — T45615A Adverse effect of thrombolytic drugs, initial encounter: Secondary | ICD-10-CM | POA: Diagnosis not present

## 2024-02-13 DIAGNOSIS — I10 Essential (primary) hypertension: Secondary | ICD-10-CM | POA: Diagnosis not present

## 2024-02-13 DIAGNOSIS — G510 Bell's palsy: Secondary | ICD-10-CM | POA: Diagnosis not present

## 2024-02-13 DIAGNOSIS — R519 Headache, unspecified: Secondary | ICD-10-CM | POA: Diagnosis not present

## 2024-02-13 DIAGNOSIS — R29810 Facial weakness: Secondary | ICD-10-CM | POA: Diagnosis not present

## 2024-02-13 DIAGNOSIS — R29898 Other symptoms and signs involving the musculoskeletal system: Secondary | ICD-10-CM | POA: Diagnosis not present

## 2024-02-13 DIAGNOSIS — Z8673 Personal history of transient ischemic attack (TIA), and cerebral infarction without residual deficits: Secondary | ICD-10-CM | POA: Diagnosis not present

## 2024-02-13 DIAGNOSIS — R079 Chest pain, unspecified: Secondary | ICD-10-CM | POA: Diagnosis not present

## 2024-02-13 DIAGNOSIS — F17210 Nicotine dependence, cigarettes, uncomplicated: Secondary | ICD-10-CM | POA: Diagnosis not present

## 2024-02-13 DIAGNOSIS — I081 Rheumatic disorders of both mitral and tricuspid valves: Secondary | ICD-10-CM | POA: Diagnosis not present

## 2024-02-13 DIAGNOSIS — R58 Hemorrhage, not elsewhere classified: Secondary | ICD-10-CM | POA: Diagnosis not present

## 2024-02-13 DIAGNOSIS — G629 Polyneuropathy, unspecified: Secondary | ICD-10-CM | POA: Diagnosis not present

## 2024-02-13 DIAGNOSIS — I160 Hypertensive urgency: Secondary | ICD-10-CM | POA: Diagnosis not present

## 2024-02-13 DIAGNOSIS — G8194 Hemiplegia, unspecified affecting left nondominant side: Secondary | ICD-10-CM | POA: Diagnosis not present

## 2024-02-13 DIAGNOSIS — R918 Other nonspecific abnormal finding of lung field: Secondary | ICD-10-CM | POA: Diagnosis not present

## 2024-02-13 DIAGNOSIS — E7800 Pure hypercholesterolemia, unspecified: Secondary | ICD-10-CM | POA: Diagnosis not present

## 2024-02-13 DIAGNOSIS — Z9049 Acquired absence of other specified parts of digestive tract: Secondary | ICD-10-CM | POA: Diagnosis not present

## 2024-02-13 DIAGNOSIS — I672 Cerebral atherosclerosis: Secondary | ICD-10-CM | POA: Diagnosis not present

## 2024-02-13 DIAGNOSIS — I700 Atherosclerosis of aorta: Secondary | ICD-10-CM | POA: Diagnosis not present

## 2024-02-13 DIAGNOSIS — Z7982 Long term (current) use of aspirin: Secondary | ICD-10-CM | POA: Diagnosis not present

## 2024-02-14 DIAGNOSIS — R001 Bradycardia, unspecified: Secondary | ICD-10-CM | POA: Diagnosis not present

## 2024-02-14 DIAGNOSIS — I639 Cerebral infarction, unspecified: Secondary | ICD-10-CM | POA: Diagnosis not present

## 2024-02-14 DIAGNOSIS — R079 Chest pain, unspecified: Secondary | ICD-10-CM | POA: Diagnosis not present

## 2024-02-14 DIAGNOSIS — G510 Bell's palsy: Secondary | ICD-10-CM | POA: Diagnosis not present

## 2024-02-14 DIAGNOSIS — I6782 Cerebral ischemia: Secondary | ICD-10-CM | POA: Diagnosis not present

## 2024-02-15 DIAGNOSIS — G43109 Migraine with aura, not intractable, without status migrainosus: Secondary | ICD-10-CM | POA: Diagnosis not present

## 2024-02-15 DIAGNOSIS — G510 Bell's palsy: Secondary | ICD-10-CM | POA: Diagnosis not present

## 2024-02-18 ENCOUNTER — Ambulatory Visit: Payer: Medicare Other | Admitting: Psychology

## 2024-02-21 DIAGNOSIS — G459 Transient cerebral ischemic attack, unspecified: Secondary | ICD-10-CM | POA: Diagnosis not present

## 2024-02-25 ENCOUNTER — Ambulatory Visit: Payer: Medicare Other | Admitting: Psychology

## 2024-02-25 DIAGNOSIS — F3181 Bipolar II disorder: Secondary | ICD-10-CM | POA: Diagnosis not present

## 2024-02-25 DIAGNOSIS — F411 Generalized anxiety disorder: Secondary | ICD-10-CM | POA: Diagnosis not present

## 2024-02-25 NOTE — Progress Notes (Signed)
 [facility] Counselor/Therapist Progress Note  Patient ID: Desiree Torres, MRN: [id number]    Date: 02/25/24  Time Spent: 4:00 pm -  4:55 pm - 55 minutes  Treatment Type: Individual Therapy.  Reported Symptoms: Feeling nervous and edge, trouble relaxing, being so restless that it is hard to sit still, becoming easily annoyed or irritable. Feeling down, sleep trouble, appetite concerns, trouble concentrating, being so fidgety that she has been moving around more than usual.   Mental Status Exam: Appearance:  Well Groomed     Behavior: Appropriate  Motor: Normal  Speech/Language:  Normal Rate  Affect: Appropriate  Mood: normal  Thought process: normal  Thought content:   WNL  Sensory/Perceptual disturbances:   WNL  Orientation: oriented to person, place, and time/date  Attention: Good  Concentration: Good  Memory: WNL  Fund of knowledge:  Good  Insight:   Good  Judgment:  Good  Impulse Control: Good   Risk Assessment: Danger to Self:  No Self-injurious Behavior: No Danger to Others: No Duty to Warn:no Physical Aggression / Violence:No  Access to Firearms a concern: No  Gang Involvement:No   Subjective:   Desiree Torres participated in the session, in person in the office with the therapist, and consented to treatment. Desiree Torres reviewed the events of the past week.   Patient had been in the ED due to stroke like symptoms, and was frustrated that medical professionals don't have guidance for her. Patient's son, Desiree Torres and his wife came to check on her when she was in the hospital. Patient discussed her plans for the future, one of which is to get better sleep, because it's been hard for her to sleep recently. Patient discussed additional plans for going to school for Criminal Justice. Patient is also very concerned about her intense headaches. Patient has a neurology appt in [place] on April 3 @ 3:00 pm. Patient's friend also named Desiree Torres will be visiting  patient in a few weeks and patient is looking forward to doing fun things with friend, Desiree Torres.   Interventions: Cognitive Behavioral Therapy  Diagnosis:  Bipolar 2 disorder, major depressive episode (HCC) F31.81 GAD Generalized Anxiety Disorder F41.1  Psychiatric Treatment: Yes , Desiree Torres, Psychiatry  Treatment Plan:  Client Abilities/Strengths Desiree Torres is engaged in sessions, motivated for change, committed to her current relationship.   Support System: Significant other Desiree Torres, and son, Desiree Torres.   Client Treatment Preferences OPT  Client Statement of Needs Desiree Torres would like to increase her motivation, set goals, increase mindfulness, symptom management, engage in consistent self-care.       Treatment Level Weekly/Biweekly  Symptoms  Anxiety:  Feeling nervous and edge, trouble relaxing, being so restless that it is hard to sit still, becoming easily annoyed or irritable.   (Status: maintained) Depression:  Feeling down, sleep trouble, appetite concerns, trouble concentrating, being so fidgety that she's been moving around more than usual (Status: maintained)  Goals:   Desiree Torres experiences symptoms of Bipolar Major Depressive Disorder, GAD Generalized Anxiety Disorder    Target Date: 01/17/2025 Frequency: Weekly/Biweekly  Progress: 0 Modality: individual    Therapist will provide referrals for additional resources as appropriate.  Therapist will provide psycho-education regarding Desiree Torres's diagnosis and corresponding treatment approaches and interventions. Desiree Torres will support the patient's ability to achieve the goals identified. will employ CBT, BA, Problem-solving, Solution Focused, Mindfulness, coping skills, & other evidenced-based practices will be used to promote progress towards healthy functioning to help manage decrease symptoms associated with their diagnosis.  Reduce overall level, frequency, and intensity of the feelings of depression, anxiety and panic  evidenced by decreased overall symptoms from 6 to 7 days/week to 0 to 1 days/week per client report for at least 3 consecutive months. Verbally express understanding of the relationship between feelings of depression and anxiety and their impact on thinking patterns and behaviors. Verbalize an understanding of the role that distorted thinking plays in creating fears, excessive worry, and ruminations.  Desiree Torres participated in the creation of the treatment plan)    Desiree Torres

## 2024-03-01 DIAGNOSIS — E538 Deficiency of other specified B group vitamins: Secondary | ICD-10-CM | POA: Diagnosis not present

## 2024-03-03 ENCOUNTER — Encounter: Payer: Medicare Other | Admitting: Psychology

## 2024-03-04 NOTE — Progress Notes (Signed)
 This encounter was created in error - please disregard.

## 2024-03-14 DIAGNOSIS — H409 Unspecified glaucoma: Secondary | ICD-10-CM | POA: Diagnosis not present

## 2024-03-17 ENCOUNTER — Ambulatory Visit (INDEPENDENT_AMBULATORY_CARE_PROVIDER_SITE_OTHER): Payer: Medicare Other | Admitting: Psychology

## 2024-03-17 DIAGNOSIS — F411 Generalized anxiety disorder: Secondary | ICD-10-CM

## 2024-03-17 DIAGNOSIS — F3181 Bipolar II disorder: Secondary | ICD-10-CM | POA: Diagnosis not present

## 2024-03-17 NOTE — Progress Notes (Signed)
 Desiree Torres Counselor/Therapist Progress Note  Patient ID: Desiree Torres, MRN: [id number]    Date: 03/17/24  Time Spent: 4:00 pm - 4:57 pm : 57 minutes  Treatment Type: Individual Therapy.  Reported Symptoms: Feeling nervous and edge, trouble relaxing, being so restless that it is hard to sit still, becoming easily annoyed or irritable. Feeling down, sleep trouble, appetite concerns, trouble concentrating, being so fidgety that she has been moving around more than usual.   Mental Status Exam: Appearance:  Well Groomed     Behavior: Appropriate  Motor: Normal  Speech/Language:  Normal Rate  Affect: Appropriate  Mood: normal  Thought process: normal  Thought content:   WNL  Sensory/Perceptual disturbances:   WNL  Orientation: oriented to person, place, and time/date  Attention: Good  Concentration: Good  Memory: WNL  Fund of knowledge:  Good  Insight:   Good  Judgment:  Good  Impulse Control: Good   Risk Assessment: Danger to Self:  No Self-injurious Behavior: No Danger to Others: No Duty to Warn:no Physical Aggression / Violence:No  Access to Firearms a concern: No  Gang Involvement:No   Subjective:   Desiree Torres participated in the session, in person in the office with the therapist, and consented to treatment. Desiree Torres reviewed the events of the past week.   Patient took the RSV immunization and felt sick for 3 days after receiving it. Patient expressed financial concerns to pay for therapy. Patient is trying to "live life to the fullest" but at the same time, worries about her Torres issues. Patient never forgets how close she came to having more than one stroke, and although she tries not to dwell on it, the concern is very much a part of her life. Patient also discussed the difficulty she has understanding why only one of her adult children stays in contact with her. We examined patient's options for dealing with her sense of loss.   Interventions:  Cognitive Behavioral Therapy  Diagnosis:   Bipolar 2 disorder, major depressive episode (HCC) F31.81 GAD Generalized Anxiety Disorder F41.1  Psychiatric Treatment: Yes , via [facility], Psychiatry  Treatment Plan:  Client Abilities/Strengths Desiree Torres is engaged in sessions, motivated for change, committed to her current relationship.   Support System: Significant other Joe, and son, Dorene Sorrow   Client Treatment Preferences OPT  Client Statement of Needs Desiree Torres would like to increase her motivation, set goals, increase mindfulness, symptom management, engage in consistent self-care    Treatment Level Weekly/Biweekly  Symptoms  Anxiety:  Feeling nervous and edge, trouble relaxing, being so restless that it is hard to sit still, becoming easily annoyed or irritable.   (Status: maintained) Depression:  Feeling down, sleep trouble, appetite concerns, trouble concentrating, being so fidgety that she's been moving around more than usual (Status: maintained)   Goals:   Desiree Torres experiences symptoms of  Bipolar Major Depressive Disorder, GAD Generalized Anxiety Disorder    Target Date: 01/17/25 Frequency: Biweekly/Weekly  Progress: 0 Modality: individual    Therapist will provide referrals for additional resources as appropriate.  Therapist will provide psycho-education regarding Desiree Torres's diagnosis and corresponding treatment approaches and interventions. Desiree Torres will support the patient's ability to achieve the goals identified. will employ CBT, BA, Problem-solving, Solution Focused, Mindfulness,  coping skills, & other evidenced-based practices will be used to promote progress towards healthy functioning to help manage decrease symptoms associated with their diagnosis.   Reduce overall level, frequency, and intensity of the feelings of depression, anxiety and panic evidenced by decreased  overall symptoms from 6 to 7 days/week to 0 to 1 days/week per client report for at least 3  consecutive months. Verbally express understanding of the relationship between feelings of depression and anxiety and their impact on thinking patterns and behaviors. Verbalize an understanding of the role that distorted thinking plays in creating fears, excessive worry, and ruminations.  Desiree Torres participated in the creation of the treatment plan)    Desiree Torres

## 2024-03-21 DIAGNOSIS — M542 Cervicalgia: Secondary | ICD-10-CM | POA: Diagnosis not present

## 2024-03-21 DIAGNOSIS — G43109 Migraine with aura, not intractable, without status migrainosus: Secondary | ICD-10-CM | POA: Diagnosis not present

## 2024-03-27 DIAGNOSIS — E538 Deficiency of other specified B group vitamins: Secondary | ICD-10-CM | POA: Diagnosis not present

## 2024-03-27 DIAGNOSIS — R911 Solitary pulmonary nodule: Secondary | ICD-10-CM | POA: Diagnosis not present

## 2024-03-27 DIAGNOSIS — M792 Neuralgia and neuritis, unspecified: Secondary | ICD-10-CM | POA: Diagnosis not present

## 2024-03-27 DIAGNOSIS — I700 Atherosclerosis of aorta: Secondary | ICD-10-CM | POA: Diagnosis not present

## 2024-03-27 DIAGNOSIS — I10 Essential (primary) hypertension: Secondary | ICD-10-CM | POA: Diagnosis not present

## 2024-03-27 DIAGNOSIS — G459 Transient cerebral ischemic attack, unspecified: Secondary | ICD-10-CM | POA: Diagnosis not present

## 2024-03-27 DIAGNOSIS — E785 Hyperlipidemia, unspecified: Secondary | ICD-10-CM | POA: Diagnosis not present

## 2024-03-27 DIAGNOSIS — J439 Emphysema, unspecified: Secondary | ICD-10-CM | POA: Diagnosis not present

## 2024-03-29 ENCOUNTER — Other Ambulatory Visit (HOSPITAL_BASED_OUTPATIENT_CLINIC_OR_DEPARTMENT_OTHER): Payer: Self-pay | Admitting: Family Medicine

## 2024-03-29 DIAGNOSIS — Z122 Encounter for screening for malignant neoplasm of respiratory organs: Secondary | ICD-10-CM

## 2024-03-31 DIAGNOSIS — G8929 Other chronic pain: Secondary | ICD-10-CM | POA: Diagnosis not present

## 2024-03-31 DIAGNOSIS — M4802 Spinal stenosis, cervical region: Secondary | ICD-10-CM | POA: Diagnosis not present

## 2024-03-31 DIAGNOSIS — G43909 Migraine, unspecified, not intractable, without status migrainosus: Secondary | ICD-10-CM | POA: Diagnosis not present

## 2024-03-31 DIAGNOSIS — M5021 Other cervical disc displacement,  high cervical region: Secondary | ICD-10-CM | POA: Diagnosis not present

## 2024-03-31 DIAGNOSIS — M47812 Spondylosis without myelopathy or radiculopathy, cervical region: Secondary | ICD-10-CM | POA: Diagnosis not present

## 2024-04-19 ENCOUNTER — Ambulatory Visit (HOSPITAL_BASED_OUTPATIENT_CLINIC_OR_DEPARTMENT_OTHER)
Admission: RE | Admit: 2024-04-19 | Discharge: 2024-04-19 | Disposition: A | Discharge status: Home / Self Care | Source: Ambulatory Visit | Attending: Family Medicine | Admitting: Family Medicine

## 2024-04-19 DIAGNOSIS — F17210 Nicotine dependence, cigarettes, uncomplicated: Secondary | ICD-10-CM | POA: Diagnosis not present

## 2024-04-19 DIAGNOSIS — I700 Atherosclerosis of aorta: Secondary | ICD-10-CM | POA: Insufficient documentation

## 2024-04-19 DIAGNOSIS — I3139 Other pericardial effusion (noninflammatory): Secondary | ICD-10-CM | POA: Insufficient documentation

## 2024-04-19 DIAGNOSIS — Z122 Encounter for screening for malignant neoplasm of respiratory organs: Secondary | ICD-10-CM | POA: Diagnosis not present

## 2024-04-19 DIAGNOSIS — I2510 Atherosclerotic heart disease of native coronary artery without angina pectoris: Secondary | ICD-10-CM | POA: Insufficient documentation

## 2024-04-19 DIAGNOSIS — J439 Emphysema, unspecified: Secondary | ICD-10-CM | POA: Diagnosis not present

## 2024-04-26 DIAGNOSIS — E538 Deficiency of other specified B group vitamins: Secondary | ICD-10-CM | POA: Diagnosis not present

## 2024-05-16 ENCOUNTER — Ambulatory Visit: Payer: Self-pay

## 2024-05-16 DIAGNOSIS — M4722 Other spondylosis with radiculopathy, cervical region: Secondary | ICD-10-CM | POA: Diagnosis not present

## 2024-05-16 NOTE — Telephone Encounter (Signed)
  Chief Complaint: routine appt with [facility] Additional Notes: Pt calling to schedule appt with [facility] with [facility]. Triager transferred call to appropriate line to further assist.   Copied from CRM [id number]. Topic: Clinical - Red Word Triage >> May 16, 2024  4:17 PM Minus Amel G wrote: Depression   Reason for Disposition  Requesting regular office appointment  Answer Assessment - Initial Assessment Questions 1. CONCERN: "What happened that made you call today?"     Trying to schedule appointment with Tobias Forth at [facility]  Protocols used: Depression-A-AH, Information Only Call - No Triage-A-AH

## 2024-05-23 ENCOUNTER — Ambulatory Visit (INDEPENDENT_AMBULATORY_CARE_PROVIDER_SITE_OTHER): Admitting: Psychology

## 2024-05-23 DIAGNOSIS — F411 Generalized anxiety disorder: Secondary | ICD-10-CM

## 2024-05-23 DIAGNOSIS — F3181 Bipolar II disorder: Secondary | ICD-10-CM | POA: Diagnosis not present

## 2024-05-23 NOTE — Progress Notes (Unsigned)
 [facility] Counselor/Therapist Progress Note  Patient ID: Pristine Gladhill, MRN: [id number]    Date: 05/23/24  Time Spent: 4:00 pm - 4:55  : 55 Minutes  Treatment Type: Individual Therapy.  Reported Symptoms: Feeling nervous and edge, trouble relaxing, being so restless that it is hard to sit still, becoming easily annoyed or irritable. Feeling down, sleep trouble, appetite concerns, trouble concentrating, being so fidgety that she has been moving around more than usual.   Mental Status Exam: Appearance:  Well Groomed     Behavior: Appropriate  Motor: Normal  Speech/Language:  Pressured  Affect: Appropriate  Mood: sad  Thought process: normal  Thought content:   WNL  Sensory/Perceptual disturbances:   WNL  Orientation: oriented to person, place, and time/date  Attention: Good  Concentration: Good  Memory: WNL  Fund of knowledge:  Good  Insight:   Good  Judgment:  Good  Impulse Control: Good   Risk Assessment: Danger to Self:  No Self-injurious Behavior: No Danger to Others: No Duty to Warn:no Physical Aggression / Violence:No  Access to Firearms a concern: No  Gang Involvement:No   Subjective:   Ardeen Beath participated in the session, in person in the office with the therapist, and consented to treatment. Pierina reviewed the events of the past week.   Patient was tearful during our session as she talked about feeling stressed in her current job, and wanting to stay in [place]  when she goes there next week to visit her cousin. Patient reported that she didn't think her psychiatric medication was working so this Clinical research associate had patient call her psychiatrist's office while in our session. Patient reached her psychiatrist's office, and was able to make an appt for tomorrow, Thursday, 6/5, at 10:45 am. Patient shared additional information about her family dynamics as well as her relationship with her significant other, Joe. Patient planned to go to her hairdresser  following our session in preparation for her trip to [place]  next week.   Interventions: Cognitive Behavioral Therapy  Diagnosis:  Bipolar 2 disorder, major depressive episode (HCC) F31.81 GAD Generalized Anxiety Disorder F41.1  Psychiatric Treatment: Crystal at [facility] is her psychiatrist  Treatment Plan:  Client Abilities/Strengths Amir is engaged in sessions, motivated for change, committed to her current relationship.     Support System: Joe, significant other, and son, Josefina Nian  Client Treatment Preferences OPT  Client Statement of Needs Shaeleigh would like to increase her motivation, set goals, increase mindfulness, symptom management, engage in consistent self-care     Treatment Level Monthly  Symptoms  Anxiety: Feeling nervous and edge, trouble relaxing, being so restless that it is hard to sit still, becoming easily annoyed or irritable.   (Status: maintained) Depression:  Feeling down, sleep trouble, appetite concerns, trouble concentrating, being so fidgety that she's been moving around more than usual (Status: maintained)  Goals:   Killian experiences symptoms of Bipolar Major Depressive Disorder, GAD Generalized Anxiety Disorder    Target Date: 01/17/25 Frequency: Monthly  Progress: 0 Modality: individual    Therapist will provide referrals for additional resources as appropriate.  Therapist will provide psycho-education regarding Daphnee's diagnosis and corresponding treatment approaches and interventions. Fran Imus will support the patient's ability to achieve the goals identified. will employ CBT, BA, Problem-solving, Solution Focused, Mindfulness,  coping skills, & other evidenced-based practices will be used to promote progress towards healthy functioning to help manage decrease symptoms associated with their diagnosis.   Reduce overall level, frequency, and intensity of the feelings of depression,  anxiety and panic evidenced by decreased  overall symptoms from 6 to 7 days/week to 0 to 1 days/week per client report for at least 3 consecutive months. Verbally express understanding of the relationship between feelings of depression and anxiety and their impact on thinking patterns and behaviors. Verbalize an understanding of the role that distorted thinking plays in creating fears, excessive worry, and ruminations.  Leola Raisin participated in the creation of the treatment plan)    Fran Imus

## 2024-05-24 ENCOUNTER — Emergency Department (HOSPITAL_COMMUNITY)
Admission: EM | Admit: 2024-05-24 | Discharge: 2024-05-24 | Discharge status: Against Medical Advice | Attending: Emergency Medicine | Admitting: Emergency Medicine

## 2024-05-24 DIAGNOSIS — Z5321 Procedure and treatment not carried out due to patient leaving prior to being seen by health care provider: Secondary | ICD-10-CM | POA: Insufficient documentation

## 2024-05-24 DIAGNOSIS — E538 Deficiency of other specified B group vitamins: Secondary | ICD-10-CM | POA: Diagnosis not present

## 2024-05-24 NOTE — ED Notes (Signed)
 Pt stated they didn't want to wait any longer and pt was taken OTF.

## 2024-06-07 DIAGNOSIS — M542 Cervicalgia: Secondary | ICD-10-CM | POA: Diagnosis not present

## 2024-06-13 DIAGNOSIS — G459 Transient cerebral ischemic attack, unspecified: Secondary | ICD-10-CM | POA: Diagnosis not present

## 2024-06-15 DIAGNOSIS — Z8619 Personal history of other infectious and parasitic diseases: Secondary | ICD-10-CM | POA: Diagnosis not present

## 2024-06-19 DIAGNOSIS — G459 Transient cerebral ischemic attack, unspecified: Secondary | ICD-10-CM | POA: Diagnosis not present

## 2024-06-19 DIAGNOSIS — E782 Mixed hyperlipidemia: Secondary | ICD-10-CM | POA: Diagnosis not present

## 2024-06-19 DIAGNOSIS — I2510 Atherosclerotic heart disease of native coronary artery without angina pectoris: Secondary | ICD-10-CM | POA: Diagnosis not present

## 2024-06-19 DIAGNOSIS — E785 Hyperlipidemia, unspecified: Secondary | ICD-10-CM | POA: Diagnosis not present

## 2024-06-22 DIAGNOSIS — E538 Deficiency of other specified B group vitamins: Secondary | ICD-10-CM | POA: Diagnosis not present

## 2024-07-11 DIAGNOSIS — R2689 Other abnormalities of gait and mobility: Secondary | ICD-10-CM | POA: Diagnosis not present

## 2024-07-11 DIAGNOSIS — G4452 New daily persistent headache (NDPH): Secondary | ICD-10-CM | POA: Diagnosis not present

## 2024-07-11 DIAGNOSIS — R531 Weakness: Secondary | ICD-10-CM | POA: Diagnosis not present

## 2024-07-11 DIAGNOSIS — R471 Dysarthria and anarthria: Secondary | ICD-10-CM | POA: Diagnosis not present

## 2024-07-20 DIAGNOSIS — G459 Transient cerebral ischemic attack, unspecified: Secondary | ICD-10-CM | POA: Diagnosis not present

## 2024-07-20 DIAGNOSIS — E782 Mixed hyperlipidemia: Secondary | ICD-10-CM | POA: Diagnosis not present

## 2024-07-20 DIAGNOSIS — E785 Hyperlipidemia, unspecified: Secondary | ICD-10-CM | POA: Diagnosis not present

## 2024-07-20 DIAGNOSIS — I2510 Atherosclerotic heart disease of native coronary artery without angina pectoris: Secondary | ICD-10-CM | POA: Diagnosis not present

## 2024-08-02 DIAGNOSIS — E538 Deficiency of other specified B group vitamins: Secondary | ICD-10-CM | POA: Diagnosis not present

## 2024-08-03 DIAGNOSIS — G4452 New daily persistent headache (NDPH): Secondary | ICD-10-CM | POA: Diagnosis not present

## 2024-08-03 DIAGNOSIS — R519 Headache, unspecified: Secondary | ICD-10-CM | POA: Diagnosis not present

## 2024-08-03 DIAGNOSIS — R471 Dysarthria and anarthria: Secondary | ICD-10-CM | POA: Diagnosis not present

## 2024-08-03 DIAGNOSIS — R531 Weakness: Secondary | ICD-10-CM | POA: Diagnosis not present

## 2024-08-03 DIAGNOSIS — I6782 Cerebral ischemia: Secondary | ICD-10-CM | POA: Diagnosis not present

## 2024-08-03 DIAGNOSIS — R2689 Other abnormalities of gait and mobility: Secondary | ICD-10-CM | POA: Diagnosis not present

## 2024-08-09 DIAGNOSIS — F17210 Nicotine dependence, cigarettes, uncomplicated: Secondary | ICD-10-CM | POA: Diagnosis not present

## 2024-08-09 DIAGNOSIS — R1013 Epigastric pain: Secondary | ICD-10-CM | POA: Diagnosis not present

## 2024-08-09 DIAGNOSIS — R110 Nausea: Secondary | ICD-10-CM | POA: Diagnosis not present

## 2024-08-09 DIAGNOSIS — Z8719 Personal history of other diseases of the digestive system: Secondary | ICD-10-CM | POA: Diagnosis not present

## 2024-08-20 DIAGNOSIS — I2510 Atherosclerotic heart disease of native coronary artery without angina pectoris: Secondary | ICD-10-CM | POA: Diagnosis not present

## 2024-08-20 DIAGNOSIS — G459 Transient cerebral ischemic attack, unspecified: Secondary | ICD-10-CM | POA: Diagnosis not present

## 2024-08-20 DIAGNOSIS — E782 Mixed hyperlipidemia: Secondary | ICD-10-CM | POA: Diagnosis not present

## 2024-08-20 DIAGNOSIS — E785 Hyperlipidemia, unspecified: Secondary | ICD-10-CM | POA: Diagnosis not present

## 2024-08-25 DIAGNOSIS — Z03818 Encounter for observation for suspected exposure to other biological agents ruled out: Secondary | ICD-10-CM | POA: Diagnosis not present

## 2024-08-25 DIAGNOSIS — J019 Acute sinusitis, unspecified: Secondary | ICD-10-CM | POA: Diagnosis not present

## 2024-08-25 DIAGNOSIS — R059 Cough, unspecified: Secondary | ICD-10-CM | POA: Diagnosis not present

## 2024-09-19 DIAGNOSIS — I2510 Atherosclerotic heart disease of native coronary artery without angina pectoris: Secondary | ICD-10-CM | POA: Diagnosis not present

## 2024-09-19 DIAGNOSIS — E782 Mixed hyperlipidemia: Secondary | ICD-10-CM | POA: Diagnosis not present

## 2024-09-19 DIAGNOSIS — E785 Hyperlipidemia, unspecified: Secondary | ICD-10-CM | POA: Diagnosis not present

## 2024-09-19 DIAGNOSIS — G459 Transient cerebral ischemic attack, unspecified: Secondary | ICD-10-CM | POA: Diagnosis not present

## 2024-09-20 DIAGNOSIS — E538 Deficiency of other specified B group vitamins: Secondary | ICD-10-CM | POA: Diagnosis not present

## 2024-09-21 ENCOUNTER — Ambulatory Visit (INDEPENDENT_AMBULATORY_CARE_PROVIDER_SITE_OTHER): Admitting: Psychology

## 2024-09-21 DIAGNOSIS — R6884 Jaw pain: Secondary | ICD-10-CM | POA: Diagnosis not present

## 2024-09-21 DIAGNOSIS — Z6821 Body mass index (BMI) 21.0-21.9, adult: Secondary | ICD-10-CM | POA: Diagnosis not present

## 2024-09-21 DIAGNOSIS — R6889 Other general symptoms and signs: Secondary | ICD-10-CM | POA: Diagnosis not present

## 2024-09-21 DIAGNOSIS — F3181 Bipolar II disorder: Secondary | ICD-10-CM | POA: Diagnosis not present

## 2024-09-21 DIAGNOSIS — F411 Generalized anxiety disorder: Secondary | ICD-10-CM

## 2024-09-21 DIAGNOSIS — M79642 Pain in left hand: Secondary | ICD-10-CM | POA: Diagnosis not present

## 2024-09-21 DIAGNOSIS — K5900 Constipation, unspecified: Secondary | ICD-10-CM | POA: Diagnosis not present

## 2024-09-21 DIAGNOSIS — E782 Mixed hyperlipidemia: Secondary | ICD-10-CM | POA: Diagnosis not present

## 2024-09-21 DIAGNOSIS — K219 Gastro-esophageal reflux disease without esophagitis: Secondary | ICD-10-CM | POA: Diagnosis not present

## 2024-09-21 NOTE — Progress Notes (Signed)
 [facility] Counselor/Therapist Progress Note  Patient ID: Desiree Torres, MRN: [id number]    Date: 09/21/24  Time Spent: 12:05 pm - 12:21 pm via video (16 minutes) then patient's internet connection went out, and she rejoined  at 12:22 - 12:45 pm via video (23 minutes). Total 39 minutes  Treatment Type: Individual Therapy.  Reported Symptoms: Patient had not contacted this writer since 05/23/24 and she was late joining our call. This Clinical research associate called the patient at [phone] and she agreed to join via [employer].   Mental Status Exam: Appearance:  Casual     Behavior: Appropriate  Motor: Normal  Speech/Language:  Clear and Coherent  Affect: Appropriate  Mood: anxious  Thought process: normal  Thought content:   WNL  Sensory/Perceptual disturbances:   WNL  Orientation: oriented to person, place, and time/date  Attention: Good  Concentration: Good  Memory: WNL  Fund of knowledge:  Good  Insight:   Good  Judgment:  Fair  Impulse Control: Good   Risk Assessment: Danger to Self:  No Self-injurious Behavior: No Danger to Others: No Duty to Warn:no Physical Aggression / Violence:No  Access to Firearms a concern: No  Gang Involvement:No   Subjective:   Desiree Torres participated from community, via video, and consented to treatment. I discussed the limitations of evaluation and management by telemedicine and the availability of in person appointments. The patient expressed understanding and agreed to proceed.  Therapist participated from home office.  Desiree Torres reviewed the events of the past week.   Patient had not contacted this writer since 05/23/24 and she was late joining our call. This Clinical research associate called the patient at [phone] and she agreed to join via [employer]. Patient was surprised that it had been almost four months since she had scheduled an appointment. Patient reported that she is driving to [place]  on October 10, with Desiree Torres. Patient will be in [place]   for a few days, while leaving her white car down in [place] . Patient requested referrals to therapist in [place], [place] and this Clinical research associate will email those referrals to her at [email or web address]  Patient reported feeling anxious and worrying about her decision to move to [place] . Patient also shared that had a decrease in energy until she made the decision to move to [place] . This Clinical research associate shared with patient the following referrals:    Desiree Torres [facility], [facility] Primary Office: [address] [place], [place]  [address] Secondary Office: [address] [place], [place]  [address] [phone] [email or web address]  Desiree Torres [address] , [place] , [place]  [address] [email or web address] [phone]  Desiree Torres [address] , [place] , [place]  [address] [phone] [email or web address]   Interventions: Cognitive Behavioral Therapy  Diagnosis: Bipolar 2 disorder, major depressive episode (HCC) [F31.81]   Psychiatric Treatment: Yes , Desiree Torres at [facility]  Treatment Plan:  Client Abilities/Strengths Desiree Torres is engaged in sessions and motivated for change  Support System: Her church and her friends  Client Treatment Preferences OPT  Client Statement of Needs Desiree Torres would like to increase her motivation and engage in consistent self-care.   Treatment Level As needed  Symptoms  Patient experiences symptoms of anxiety and depression  Goals:   Target Date: 01/17/25 Frequency: As needed  Progress: 15% Modality: individual    Therapist will provide referrals for additional resources as appropriate.  Therapist will provide psycho-education regarding Desiree Torres's diagnosis and corresponding treatment approaches and interventions. Desiree Torres will support the patient's ability to achieve the goals identified. will employ CBT, BA, Problem-solving, Solution Focused,  Mindfulness,  coping skills, & other  evidenced-based practices will be used to promote progress towards healthy functioning to help manage decrease symptoms associated with their diagnosis.   Reduce overall level, frequency, and intensity of the feelings of depression, anxiety and panic evidenced by decreased overall symptoms from 6 to 7 days/week to 0 to 1 days/week per client report for at least 3 consecutive months. Verbally express understanding of the relationship between feelings of depression and anxiety and their impact on thinking patterns and behaviors. Verbalize an understanding of the role that distorted thinking plays in creating fears, excessive worry, and ruminations.  Audria participated in the creation of the treatment plan)    Desiree Torres

## 2024-09-27 DIAGNOSIS — H33312 Horseshoe tear of retina without detachment, left eye: Secondary | ICD-10-CM | POA: Diagnosis not present

## 2024-10-11 DIAGNOSIS — H31092 Other chorioretinal scars, left eye: Secondary | ICD-10-CM | POA: Diagnosis not present
